# Patient Record
Sex: Female | Born: 1960
Health system: Southern US, Community
[De-identification: ages and names within clinical notes are randomized; demographics above are authoritative.]

## PROBLEM LIST (undated history)

## (undated) DIAGNOSIS — F32A Depression, unspecified: Secondary | ICD-10-CM

## (undated) DIAGNOSIS — F329 Major depressive disorder, single episode, unspecified: Secondary | ICD-10-CM

## (undated) DIAGNOSIS — J189 Pneumonia, unspecified organism: Secondary | ICD-10-CM

## (undated) DIAGNOSIS — M1990 Unspecified osteoarthritis, unspecified site: Secondary | ICD-10-CM

## (undated) DIAGNOSIS — F419 Anxiety disorder, unspecified: Secondary | ICD-10-CM

## (undated) HISTORY — PX: APPENDECTOMY: SHX54

## (undated) HISTORY — DX: Major depressive disorder, single episode, unspecified: F32.9

## (undated) HISTORY — DX: Anxiety disorder, unspecified: F41.9

## (undated) HISTORY — PX: KNEE ARTHROSCOPY: SUR90

## (undated) HISTORY — PX: GALLBLADDER SURGERY: SHX652

## (undated) HISTORY — DX: Unspecified osteoarthritis, unspecified site: M19.90

## (undated) HISTORY — DX: Depression, unspecified: F32.A

## (undated) HISTORY — PX: TONSILLECTOMY: SUR1361

---

## 2007-05-28 ENCOUNTER — Emergency Department (HOSPITAL_COMMUNITY): Admission: EM | Admit: 2007-05-28 | Discharge: 2007-05-28 | Payer: Self-pay | Admitting: Emergency Medicine

## 2011-08-21 LAB — URINALYSIS, ROUTINE W REFLEX MICROSCOPIC
Bilirubin Urine: NEGATIVE
Glucose, UA: NEGATIVE
Hgb urine dipstick: NEGATIVE
Ketones, ur: NEGATIVE
Nitrite: NEGATIVE
Protein, ur: NEGATIVE
Specific Gravity, Urine: 1.006
Urobilinogen, UA: 1
pH: 7

## 2011-08-21 LAB — CBC
HCT: 38.6
Hemoglobin: 13.5
RBC: 4.62
WBC: 8.9

## 2011-08-21 LAB — URINE MICROSCOPIC-ADD ON

## 2011-08-21 LAB — DIFFERENTIAL
Basophils Absolute: 0
Basophils Relative: 0
Eosinophils Absolute: 0.2
Eosinophils Relative: 2
Lymphocytes Relative: 34
Lymphs Abs: 3
Monocytes Absolute: 0.6
Monocytes Relative: 7
Neutro Abs: 5.1
Neutrophils Relative %: 57

## 2011-08-21 LAB — BASIC METABOLIC PANEL WITH GFR
BUN: 8
CO2: 26
Calcium: 9
Chloride: 112
Creatinine, Ser: 0.7
GFR calc non Af Amer: >60
Glucose, Bld: 122 — ABNORMAL HIGH
Potassium: 3.7
Sodium: 145

## 2011-08-21 LAB — PREGNANCY, URINE: Preg Test, Ur: NEGATIVE

## 2012-06-18 DIAGNOSIS — E782 Mixed hyperlipidemia: Secondary | ICD-10-CM | POA: Diagnosis not present

## 2012-06-18 DIAGNOSIS — R7309 Other abnormal glucose: Secondary | ICD-10-CM | POA: Diagnosis not present

## 2012-06-18 DIAGNOSIS — K219 Gastro-esophageal reflux disease without esophagitis: Secondary | ICD-10-CM | POA: Diagnosis not present

## 2012-06-18 DIAGNOSIS — B354 Tinea corporis: Secondary | ICD-10-CM | POA: Diagnosis not present

## 2012-06-18 DIAGNOSIS — R609 Edema, unspecified: Secondary | ICD-10-CM | POA: Diagnosis not present

## 2012-06-18 DIAGNOSIS — Z1231 Encounter for screening mammogram for malignant neoplasm of breast: Secondary | ICD-10-CM | POA: Diagnosis not present

## 2012-09-18 DIAGNOSIS — K219 Gastro-esophageal reflux disease without esophagitis: Secondary | ICD-10-CM | POA: Diagnosis not present

## 2012-09-18 DIAGNOSIS — E782 Mixed hyperlipidemia: Secondary | ICD-10-CM | POA: Diagnosis not present

## 2012-09-18 DIAGNOSIS — R7309 Other abnormal glucose: Secondary | ICD-10-CM | POA: Diagnosis not present

## 2012-09-18 DIAGNOSIS — Z6841 Body Mass Index (BMI) 40.0 and over, adult: Secondary | ICD-10-CM | POA: Diagnosis not present

## 2012-09-18 DIAGNOSIS — Z1231 Encounter for screening mammogram for malignant neoplasm of breast: Secondary | ICD-10-CM | POA: Diagnosis not present

## 2012-09-30 DIAGNOSIS — Z1231 Encounter for screening mammogram for malignant neoplasm of breast: Secondary | ICD-10-CM | POA: Diagnosis not present

## 2012-11-20 DIAGNOSIS — L0201 Cutaneous abscess of face: Secondary | ICD-10-CM | POA: Diagnosis not present

## 2012-11-20 DIAGNOSIS — Z2089 Contact with and (suspected) exposure to other communicable diseases: Secondary | ICD-10-CM | POA: Diagnosis not present

## 2012-11-20 DIAGNOSIS — L03211 Cellulitis of face: Secondary | ICD-10-CM | POA: Diagnosis not present

## 2013-04-15 DIAGNOSIS — K219 Gastro-esophageal reflux disease without esophagitis: Secondary | ICD-10-CM | POA: Diagnosis not present

## 2013-04-15 DIAGNOSIS — J0100 Acute maxillary sinusitis, unspecified: Secondary | ICD-10-CM | POA: Diagnosis not present

## 2013-04-15 DIAGNOSIS — I8390 Asymptomatic varicose veins of unspecified lower extremity: Secondary | ICD-10-CM | POA: Diagnosis not present

## 2013-04-15 DIAGNOSIS — E782 Mixed hyperlipidemia: Secondary | ICD-10-CM | POA: Diagnosis not present

## 2013-04-15 DIAGNOSIS — R7309 Other abnormal glucose: Secondary | ICD-10-CM | POA: Diagnosis not present

## 2013-08-27 DIAGNOSIS — K219 Gastro-esophageal reflux disease without esophagitis: Secondary | ICD-10-CM | POA: Diagnosis not present

## 2013-08-27 DIAGNOSIS — Z1231 Encounter for screening mammogram for malignant neoplasm of breast: Secondary | ICD-10-CM | POA: Diagnosis not present

## 2013-08-27 DIAGNOSIS — R7309 Other abnormal glucose: Secondary | ICD-10-CM | POA: Diagnosis not present

## 2013-08-27 DIAGNOSIS — M25579 Pain in unspecified ankle and joints of unspecified foot: Secondary | ICD-10-CM | POA: Diagnosis not present

## 2013-08-27 DIAGNOSIS — E782 Mixed hyperlipidemia: Secondary | ICD-10-CM | POA: Diagnosis not present

## 2013-11-12 DIAGNOSIS — F341 Dysthymic disorder: Secondary | ICD-10-CM | POA: Diagnosis not present

## 2013-11-12 DIAGNOSIS — B370 Candidal stomatitis: Secondary | ICD-10-CM | POA: Diagnosis not present

## 2013-11-25 DIAGNOSIS — N393 Stress incontinence (female) (male): Secondary | ICD-10-CM | POA: Diagnosis not present

## 2013-11-25 DIAGNOSIS — N3944 Nocturnal enuresis: Secondary | ICD-10-CM | POA: Diagnosis not present

## 2013-11-25 DIAGNOSIS — N390 Urinary tract infection, site not specified: Secondary | ICD-10-CM | POA: Diagnosis not present

## 2013-11-25 DIAGNOSIS — N318 Other neuromuscular dysfunction of bladder: Secondary | ICD-10-CM | POA: Diagnosis not present

## 2013-11-27 DIAGNOSIS — N3944 Nocturnal enuresis: Secondary | ICD-10-CM | POA: Diagnosis not present

## 2013-11-27 DIAGNOSIS — N393 Stress incontinence (female) (male): Secondary | ICD-10-CM | POA: Diagnosis not present

## 2013-11-27 DIAGNOSIS — N3941 Urge incontinence: Secondary | ICD-10-CM | POA: Diagnosis not present

## 2013-11-27 DIAGNOSIS — N318 Other neuromuscular dysfunction of bladder: Secondary | ICD-10-CM | POA: Diagnosis not present

## 2013-12-10 DIAGNOSIS — E782 Mixed hyperlipidemia: Secondary | ICD-10-CM | POA: Diagnosis not present

## 2013-12-10 DIAGNOSIS — F341 Dysthymic disorder: Secondary | ICD-10-CM | POA: Diagnosis not present

## 2013-12-10 DIAGNOSIS — R7309 Other abnormal glucose: Secondary | ICD-10-CM | POA: Diagnosis not present

## 2013-12-10 DIAGNOSIS — Z1231 Encounter for screening mammogram for malignant neoplasm of breast: Secondary | ICD-10-CM | POA: Diagnosis not present

## 2013-12-10 DIAGNOSIS — K219 Gastro-esophageal reflux disease without esophagitis: Secondary | ICD-10-CM | POA: Diagnosis not present

## 2014-01-16 DIAGNOSIS — N318 Other neuromuscular dysfunction of bladder: Secondary | ICD-10-CM | POA: Diagnosis not present

## 2014-01-16 DIAGNOSIS — N393 Stress incontinence (female) (male): Secondary | ICD-10-CM | POA: Diagnosis not present

## 2014-01-16 DIAGNOSIS — N3941 Urge incontinence: Secondary | ICD-10-CM | POA: Diagnosis not present

## 2014-01-16 DIAGNOSIS — N390 Urinary tract infection, site not specified: Secondary | ICD-10-CM | POA: Diagnosis not present

## 2014-01-30 DIAGNOSIS — N3941 Urge incontinence: Secondary | ICD-10-CM | POA: Diagnosis not present

## 2014-01-30 DIAGNOSIS — N390 Urinary tract infection, site not specified: Secondary | ICD-10-CM | POA: Diagnosis not present

## 2014-02-24 DIAGNOSIS — M25559 Pain in unspecified hip: Secondary | ICD-10-CM | POA: Diagnosis not present

## 2014-03-10 ENCOUNTER — Ambulatory Visit (INDEPENDENT_AMBULATORY_CARE_PROVIDER_SITE_OTHER): Payer: BC Managed Care – PPO | Admitting: Podiatrist

## 2014-03-10 ENCOUNTER — Encounter: Payer: Self-pay | Admitting: Podiatrist

## 2014-03-10 ENCOUNTER — Ambulatory Visit (INDEPENDENT_AMBULATORY_CARE_PROVIDER_SITE_OTHER): Payer: BC Managed Care – PPO

## 2014-03-10 DIAGNOSIS — M21619 Bunion of unspecified foot: Secondary | ICD-10-CM | POA: Diagnosis not present

## 2014-03-10 DIAGNOSIS — R52 Pain, unspecified: Secondary | ICD-10-CM

## 2014-03-10 DIAGNOSIS — R609 Edema, unspecified: Secondary | ICD-10-CM

## 2014-03-10 DIAGNOSIS — M216X9 Other acquired deformities of unspecified foot: Secondary | ICD-10-CM | POA: Diagnosis not present

## 2014-03-10 NOTE — Progress Notes (Signed)
   Subjective:    Patient ID: Ellen Bell, female    DOB: 08/02/1961, 53 y.o.   MRN: [id number]  HPI my feet on top and all the way to the front of the ankle and they have been hurting for about five to six months and throbbing and numbness and tingling in toes and swelling     Review of Systems  Constitutional: Positive for activity change.  HENT: Positive for sinus pressure and sore throat.   Cardiovascular: Positive for leg swelling.  Gastrointestinal: Positive for constipation.  Musculoskeletal: Positive for gait problem.       Joint pain   Skin: Positive for color change.  All other systems reviewed and are negative.      Objective:   Physical Exam GENERAL APPEARANCE: Alert, conversant. Appropriately groomed. No acute distress.  VASCULAR: Pedal pulses palpable at 2/4 DP and PT bilateral.  Swelling on the left greater than right lower leg is noted bilateral. Varicose veins are present bilateral.. Indurated tissue is present left greater than right. NEUROLOGIC: sensation is intact epicritically and protectively to 5.07 monofilament at 5/5 sites bilateral.  Light touch is intact bilateral, vibratory sensation intact bilateral, achilles tendon reflex is intact bilateral.  MUSCULOSKELETAL: Bunion deformities bilateral with contracted and crowded hammertoes are present bilateral. Multiple dorsiflexion plantarflexion inversion eversion present. DERMATOLOGIC: Large hyperkeratotic lesions present submetatarsal 2 through 5 bilateral. Mild discomfort present.       Assessment & Plan:  Swelling, edema, and calluses bilateral  Plan: Recommended compressive stockings elastic therapy. Also recommended orthotic inserts with a metatarsal pad to reduce the pressure from lesions submetatarsal bilaterally. We will check on her orthotic coverage. I will see her back for dispensing of inserts if she decides to get these. Otherwise she'll be seen back as needed.

## 2014-03-10 NOTE — Patient Instructions (Signed)
Edema Edema is an abnormal build-up of fluids in tissues. Because this is partly dependent on gravity (water flows to the lowest place), it is more common in the legs and thighs (lower extremities). It is also common in the looser tissues, like around the eyes. Painless swelling of the feet and ankles is common and increases as a person ages. It may affect both legs and may include the calves or even thighs. When squeezed, the fluid may move out of the affected area and may leave a dent for a few moments. CAUSES   Prolonged standing or sitting in one place for extended periods of time. Movement helps pump tissue fluid into the veins, and absence of movement prevents this, resulting in edema.  Varicose veins. The valves in the veins do not work as well as they should. This causes fluid to leak into the tissues.  Fluid and salt overload.  Injury, burn, or surgery to the leg, ankle, or foot, may damage veins and allow fluid to leak out.  Sunburn damages vessels. Leaky vessels allow fluid to go out into the sunburned tissues.  Allergies (from insect bites or stings, medications or chemicals) cause swelling by allowing vessels to become leaky.  Protein in the blood helps keep fluid in your vessels. Low protein, as in malnutrition, allows fluid to leak out.  Hormonal changes, including pregnancy and menstruation, cause fluid retention. This fluid may leak out of vessels and cause edema.  Medications that cause fluid retention. Examples are sex hormones, blood pressure medications, steroid treatment, or anti-depressants.  Some illnesses cause edema, especially heart failure, kidney disease, or liver disease.  Surgery that cuts veins or lymph nodes, such as surgery done for the heart or for breast cancer, may result in edema. DIAGNOSIS  Your caregiver is usually easily able to determine what is causing your swelling (edema) by simply asking what is wrong (getting a history) and examining you (doing  a physical). Sometimes x-rays, EKG (electrocardiogram or heart tracing), and blood work may be done to evaluate for underlying medical illness. TREATMENT  General treatment includes:  Leg elevation (or elevation of the affected body part).  Restriction of fluid intake.  Prevention of fluid overload.  Compression of the affected body part. Compression with elastic bandages or support stockings squeezes the tissues, preventing fluid from entering and forcing it back into the blood vessels.  Diuretics (also called water pills or fluid pills) pull fluid out of your body in the form of increased urination. These are effective in reducing the swelling, but can have side effects and must be used only under your caregiver's supervision. Diuretics are appropriate only for some types of edema. The specific treatment can be directed at any underlying causes discovered. Heart, liver, or kidney disease should be treated appropriately. HOME CARE INSTRUCTIONS   Elevate the legs (or affected body part) above the level of the heart, while lying down.  Avoid sitting or standing still for prolonged periods of time.  Avoid putting anything directly under the knees when lying down, and do not wear constricting clothing or garters on the upper legs.  Exercising the legs causes the fluid to work back into the veins and lymphatic channels. This may help the swelling go down.  The pressure applied by elastic bandages or support stockings can help reduce ankle swelling.  A low-salt diet may help reduce fluid retention and decrease the ankle swelling.  Take any medications exactly as prescribed. SEEK MEDICAL CARE IF:  Your edema is   not responding to recommended treatments. SEEK IMMEDIATE MEDICAL CARE IF:   You develop shortness of breath or chest pain.  You cannot breathe when you lay down; or if, while lying down, you have to get up and go to the window to get your breath.  You are having increasing  swelling without relief from treatment.  You develop a fever over 102 F (38.9 C).  You develop pain or redness in the areas that are swollen.  Tell your caregiver right away if you have gained 03 lb/1.4 kg in 1 day or 05 lb/2.3 kg in a week. MAKE SURE YOU:   Understand these instructions.  Will watch your condition.  Will get help right away if you are not doing well or get worse. Document Released: 10/23/2005 Document Revised: 04/23/2012 Document Reviewed: 06/10/2008 [employer] Patient Information 2014 [employer].  

## 2014-03-11 DIAGNOSIS — M25559 Pain in unspecified hip: Secondary | ICD-10-CM | POA: Diagnosis not present

## 2014-03-11 DIAGNOSIS — M259 Joint disorder, unspecified: Secondary | ICD-10-CM | POA: Diagnosis not present

## 2014-03-12 DIAGNOSIS — R7309 Other abnormal glucose: Secondary | ICD-10-CM | POA: Diagnosis not present

## 2014-03-12 DIAGNOSIS — E782 Mixed hyperlipidemia: Secondary | ICD-10-CM | POA: Diagnosis not present

## 2014-03-12 DIAGNOSIS — K219 Gastro-esophageal reflux disease without esophagitis: Secondary | ICD-10-CM | POA: Diagnosis not present

## 2014-03-12 DIAGNOSIS — F341 Dysthymic disorder: Secondary | ICD-10-CM | POA: Diagnosis not present

## 2014-06-12 DIAGNOSIS — R7309 Other abnormal glucose: Secondary | ICD-10-CM | POA: Diagnosis not present

## 2014-06-12 DIAGNOSIS — F341 Dysthymic disorder: Secondary | ICD-10-CM | POA: Diagnosis not present

## 2014-06-12 DIAGNOSIS — E782 Mixed hyperlipidemia: Secondary | ICD-10-CM | POA: Diagnosis not present

## 2014-06-12 DIAGNOSIS — K219 Gastro-esophageal reflux disease without esophagitis: Secondary | ICD-10-CM | POA: Diagnosis not present

## 2014-06-16 DIAGNOSIS — Z1231 Encounter for screening mammogram for malignant neoplasm of breast: Secondary | ICD-10-CM | POA: Diagnosis not present

## 2014-12-08 DIAGNOSIS — R7301 Impaired fasting glucose: Secondary | ICD-10-CM | POA: Diagnosis not present

## 2014-12-08 DIAGNOSIS — M25551 Pain in right hip: Secondary | ICD-10-CM | POA: Diagnosis not present

## 2014-12-08 DIAGNOSIS — K219 Gastro-esophageal reflux disease without esophagitis: Secondary | ICD-10-CM | POA: Diagnosis not present

## 2014-12-08 DIAGNOSIS — Z5181 Encounter for therapeutic drug level monitoring: Secondary | ICD-10-CM | POA: Diagnosis not present

## 2014-12-08 DIAGNOSIS — E782 Mixed hyperlipidemia: Secondary | ICD-10-CM | POA: Diagnosis not present

## 2015-04-07 DIAGNOSIS — M179 Osteoarthritis of knee, unspecified: Secondary | ICD-10-CM | POA: Diagnosis not present

## 2015-05-13 DIAGNOSIS — M150 Primary generalized (osteo)arthritis: Secondary | ICD-10-CM | POA: Diagnosis not present

## 2015-05-13 DIAGNOSIS — E785 Hyperlipidemia, unspecified: Secondary | ICD-10-CM | POA: Diagnosis not present

## 2015-05-13 DIAGNOSIS — F328 Other depressive episodes: Secondary | ICD-10-CM | POA: Diagnosis not present

## 2015-07-21 DIAGNOSIS — N760 Acute vaginitis: Secondary | ICD-10-CM | POA: Diagnosis not present

## 2015-07-21 DIAGNOSIS — M150 Primary generalized (osteo)arthritis: Secondary | ICD-10-CM | POA: Diagnosis not present

## 2015-07-28 DIAGNOSIS — R21 Rash and other nonspecific skin eruption: Secondary | ICD-10-CM | POA: Diagnosis not present

## 2015-07-28 DIAGNOSIS — B372 Candidiasis of skin and nail: Secondary | ICD-10-CM | POA: Diagnosis not present

## 2015-07-28 DIAGNOSIS — E785 Hyperlipidemia, unspecified: Secondary | ICD-10-CM | POA: Diagnosis not present

## 2015-07-28 DIAGNOSIS — F328 Other depressive episodes: Secondary | ICD-10-CM | POA: Diagnosis not present

## 2015-07-28 DIAGNOSIS — M150 Primary generalized (osteo)arthritis: Secondary | ICD-10-CM | POA: Diagnosis not present

## 2015-09-07 DIAGNOSIS — Z01411 Encounter for gynecological examination (general) (routine) with abnormal findings: Secondary | ICD-10-CM | POA: Diagnosis not present

## 2015-09-07 DIAGNOSIS — F17200 Nicotine dependence, unspecified, uncomplicated: Secondary | ICD-10-CM | POA: Diagnosis not present

## 2015-09-07 DIAGNOSIS — Z1211 Encounter for screening for malignant neoplasm of colon: Secondary | ICD-10-CM | POA: Diagnosis not present

## 2015-09-07 DIAGNOSIS — F320 Major depressive disorder, single episode, mild: Secondary | ICD-10-CM | POA: Diagnosis not present

## 2015-09-07 DIAGNOSIS — F3289 Other specified depressive episodes: Secondary | ICD-10-CM | POA: Diagnosis not present

## 2015-12-08 DIAGNOSIS — E785 Hyperlipidemia, unspecified: Secondary | ICD-10-CM | POA: Diagnosis not present

## 2015-12-08 DIAGNOSIS — M150 Primary generalized (osteo)arthritis: Secondary | ICD-10-CM | POA: Diagnosis not present

## 2015-12-08 DIAGNOSIS — B372 Candidiasis of skin and nail: Secondary | ICD-10-CM | POA: Diagnosis not present

## 2016-01-31 DIAGNOSIS — M150 Primary generalized (osteo)arthritis: Secondary | ICD-10-CM | POA: Diagnosis not present

## 2016-05-08 DIAGNOSIS — R0902 Hypoxemia: Secondary | ICD-10-CM | POA: Diagnosis not present

## 2016-05-08 DIAGNOSIS — R062 Wheezing: Secondary | ICD-10-CM | POA: Diagnosis not present

## 2016-05-08 DIAGNOSIS — J189 Pneumonia, unspecified organism: Secondary | ICD-10-CM | POA: Diagnosis not present

## 2016-05-08 DIAGNOSIS — R0602 Shortness of breath: Secondary | ICD-10-CM | POA: Diagnosis not present

## 2016-06-01 DIAGNOSIS — M150 Primary generalized (osteo)arthritis: Secondary | ICD-10-CM | POA: Diagnosis not present

## 2016-06-01 DIAGNOSIS — R609 Edema, unspecified: Secondary | ICD-10-CM | POA: Diagnosis not present

## 2016-06-01 DIAGNOSIS — E785 Hyperlipidemia, unspecified: Secondary | ICD-10-CM | POA: Diagnosis not present

## 2016-06-01 DIAGNOSIS — K219 Gastro-esophageal reflux disease without esophagitis: Secondary | ICD-10-CM | POA: Diagnosis not present

## 2016-07-05 ENCOUNTER — Other Ambulatory Visit: Payer: Self-pay | Admitting: Pharmacist

## 2016-07-05 NOTE — Patient Outreach (Signed)
Outreach call to Ellen Bell regarding her [facility] for [facility]. Left a HIPAA compliant message on the patient's voicemail.  Duanne MoronElisabeth Junah Yam, PharmD Clinical Pharmacist [facility] Care Management [phone]

## 2016-09-18 DIAGNOSIS — E785 Hyperlipidemia, unspecified: Secondary | ICD-10-CM | POA: Diagnosis not present

## 2016-09-18 DIAGNOSIS — Z0000 Encounter for general adult medical examination without abnormal findings: Secondary | ICD-10-CM | POA: Diagnosis not present

## 2016-09-18 DIAGNOSIS — M150 Primary generalized (osteo)arthritis: Secondary | ICD-10-CM | POA: Diagnosis not present

## 2016-09-18 DIAGNOSIS — J069 Acute upper respiratory infection, unspecified: Secondary | ICD-10-CM | POA: Diagnosis not present

## 2016-09-21 DIAGNOSIS — R0989 Other specified symptoms and signs involving the circulatory and respiratory systems: Secondary | ICD-10-CM | POA: Diagnosis not present

## 2016-09-21 DIAGNOSIS — R0602 Shortness of breath: Secondary | ICD-10-CM | POA: Diagnosis not present

## 2016-09-21 DIAGNOSIS — R05 Cough: Secondary | ICD-10-CM | POA: Diagnosis not present

## 2016-11-01 DIAGNOSIS — E7800 Pure hypercholesterolemia, unspecified: Secondary | ICD-10-CM | POA: Diagnosis not present

## 2016-11-01 DIAGNOSIS — B372 Candidiasis of skin and nail: Secondary | ICD-10-CM | POA: Diagnosis not present

## 2016-12-21 DIAGNOSIS — R109 Unspecified abdominal pain: Secondary | ICD-10-CM | POA: Diagnosis not present

## 2016-12-21 DIAGNOSIS — N23 Unspecified renal colic: Secondary | ICD-10-CM | POA: Diagnosis not present

## 2017-03-05 DIAGNOSIS — D649 Anemia, unspecified: Secondary | ICD-10-CM | POA: Diagnosis not present

## 2017-03-05 DIAGNOSIS — E7800 Pure hypercholesterolemia, unspecified: Secondary | ICD-10-CM | POA: Diagnosis not present

## 2017-05-14 DIAGNOSIS — N3281 Overactive bladder: Secondary | ICD-10-CM | POA: Diagnosis not present

## 2017-06-18 DIAGNOSIS — N3281 Overactive bladder: Secondary | ICD-10-CM | POA: Diagnosis not present

## 2017-06-18 DIAGNOSIS — R682 Dry mouth, unspecified: Secondary | ICD-10-CM | POA: Diagnosis not present

## 2017-06-22 DIAGNOSIS — M170 Bilateral primary osteoarthritis of knee: Secondary | ICD-10-CM | POA: Diagnosis not present

## 2017-07-02 DIAGNOSIS — K140 Glossitis: Secondary | ICD-10-CM | POA: Diagnosis not present

## 2017-08-07 DIAGNOSIS — N3281 Overactive bladder: Secondary | ICD-10-CM | POA: Diagnosis not present

## 2017-08-29 DIAGNOSIS — K219 Gastro-esophageal reflux disease without esophagitis: Secondary | ICD-10-CM | POA: Diagnosis not present

## 2017-08-29 DIAGNOSIS — D649 Anemia, unspecified: Secondary | ICD-10-CM | POA: Diagnosis not present

## 2017-08-29 DIAGNOSIS — E7800 Pure hypercholesterolemia, unspecified: Secondary | ICD-10-CM | POA: Diagnosis not present

## 2017-08-29 DIAGNOSIS — Z79891 Long term (current) use of opiate analgesic: Secondary | ICD-10-CM | POA: Diagnosis not present

## 2017-09-24 DIAGNOSIS — N3281 Overactive bladder: Secondary | ICD-10-CM | POA: Diagnosis not present

## 2017-11-12 DIAGNOSIS — B029 Zoster without complications: Secondary | ICD-10-CM | POA: Diagnosis not present

## 2018-01-11 DIAGNOSIS — I80202 Phlebitis and thrombophlebitis of unspecified deep vessels of left lower extremity: Secondary | ICD-10-CM | POA: Diagnosis not present

## 2018-01-11 DIAGNOSIS — I83813 Varicose veins of bilateral lower extremities with pain: Secondary | ICD-10-CM | POA: Diagnosis not present

## 2018-01-11 DIAGNOSIS — R600 Localized edema: Secondary | ICD-10-CM | POA: Diagnosis not present

## 2018-01-16 DIAGNOSIS — I80202 Phlebitis and thrombophlebitis of unspecified deep vessels of left lower extremity: Secondary | ICD-10-CM | POA: Diagnosis not present

## 2018-01-16 DIAGNOSIS — I83813 Varicose veins of bilateral lower extremities with pain: Secondary | ICD-10-CM | POA: Diagnosis not present

## 2018-01-16 DIAGNOSIS — R600 Localized edema: Secondary | ICD-10-CM | POA: Diagnosis not present

## 2018-02-01 DIAGNOSIS — M150 Primary generalized (osteo)arthritis: Secondary | ICD-10-CM | POA: Diagnosis not present

## 2018-02-01 DIAGNOSIS — Z01419 Encounter for gynecological examination (general) (routine) without abnormal findings: Secondary | ICD-10-CM | POA: Diagnosis not present

## 2018-02-01 DIAGNOSIS — E785 Hyperlipidemia, unspecified: Secondary | ICD-10-CM | POA: Diagnosis not present

## 2018-02-01 DIAGNOSIS — Z1389 Encounter for screening for other disorder: Secondary | ICD-10-CM | POA: Diagnosis not present

## 2018-02-01 DIAGNOSIS — K219 Gastro-esophageal reflux disease without esophagitis: Secondary | ICD-10-CM | POA: Diagnosis not present

## 2018-02-01 DIAGNOSIS — Z23 Encounter for immunization: Secondary | ICD-10-CM | POA: Diagnosis not present

## 2018-02-25 ENCOUNTER — Encounter: Payer: Self-pay | Admitting: Podiatry

## 2018-03-04 NOTE — Progress Notes (Signed)
Erroneous

## 2018-04-15 DIAGNOSIS — R1084 Generalized abdominal pain: Secondary | ICD-10-CM | POA: Diagnosis not present

## 2018-04-15 DIAGNOSIS — N3281 Overactive bladder: Secondary | ICD-10-CM | POA: Diagnosis not present

## 2018-04-15 DIAGNOSIS — R509 Fever, unspecified: Secondary | ICD-10-CM | POA: Diagnosis not present

## 2018-04-16 DIAGNOSIS — R110 Nausea: Secondary | ICD-10-CM | POA: Diagnosis not present

## 2018-04-16 DIAGNOSIS — R509 Fever, unspecified: Secondary | ICD-10-CM | POA: Diagnosis not present

## 2018-04-16 DIAGNOSIS — R1084 Generalized abdominal pain: Secondary | ICD-10-CM | POA: Diagnosis not present

## 2018-05-06 DIAGNOSIS — E785 Hyperlipidemia, unspecified: Secondary | ICD-10-CM | POA: Diagnosis not present

## 2018-05-06 DIAGNOSIS — D649 Anemia, unspecified: Secondary | ICD-10-CM | POA: Diagnosis not present

## 2018-05-06 DIAGNOSIS — M150 Primary generalized (osteo)arthritis: Secondary | ICD-10-CM | POA: Diagnosis not present

## 2018-05-06 DIAGNOSIS — K219 Gastro-esophageal reflux disease without esophagitis: Secondary | ICD-10-CM | POA: Diagnosis not present

## 2018-06-26 DIAGNOSIS — I8312 Varicose veins of left lower extremity with inflammation: Secondary | ICD-10-CM | POA: Diagnosis not present

## 2018-06-26 DIAGNOSIS — I8311 Varicose veins of right lower extremity with inflammation: Secondary | ICD-10-CM | POA: Diagnosis not present

## 2018-09-27 DIAGNOSIS — D649 Anemia, unspecified: Secondary | ICD-10-CM | POA: Diagnosis not present

## 2018-09-27 DIAGNOSIS — Z23 Encounter for immunization: Secondary | ICD-10-CM | POA: Diagnosis not present

## 2018-09-27 DIAGNOSIS — M150 Primary generalized (osteo)arthritis: Secondary | ICD-10-CM | POA: Diagnosis not present

## 2018-09-27 DIAGNOSIS — K219 Gastro-esophageal reflux disease without esophagitis: Secondary | ICD-10-CM | POA: Diagnosis not present

## 2018-09-27 DIAGNOSIS — E7800 Pure hypercholesterolemia, unspecified: Secondary | ICD-10-CM | POA: Diagnosis not present

## 2018-10-07 DIAGNOSIS — L853 Xerosis cutis: Secondary | ICD-10-CM | POA: Diagnosis not present

## 2018-10-07 DIAGNOSIS — L84 Corns and callosities: Secondary | ICD-10-CM | POA: Diagnosis not present

## 2018-10-07 DIAGNOSIS — M2011 Hallux valgus (acquired), right foot: Secondary | ICD-10-CM | POA: Diagnosis not present

## 2018-10-07 DIAGNOSIS — M2012 Hallux valgus (acquired), left foot: Secondary | ICD-10-CM | POA: Diagnosis not present

## 2018-10-12 DIAGNOSIS — R0689 Other abnormalities of breathing: Secondary | ICD-10-CM | POA: Diagnosis not present

## 2018-10-12 DIAGNOSIS — J449 Chronic obstructive pulmonary disease, unspecified: Secondary | ICD-10-CM | POA: Diagnosis not present

## 2018-10-12 DIAGNOSIS — J189 Pneumonia, unspecified organism: Secondary | ICD-10-CM | POA: Diagnosis not present

## 2018-10-12 DIAGNOSIS — F418 Other specified anxiety disorders: Secondary | ICD-10-CM

## 2018-10-12 DIAGNOSIS — A419 Sepsis, unspecified organism: Secondary | ICD-10-CM | POA: Diagnosis not present

## 2018-10-12 DIAGNOSIS — R0902 Hypoxemia: Secondary | ICD-10-CM | POA: Diagnosis not present

## 2018-10-12 DIAGNOSIS — L03116 Cellulitis of left lower limb: Secondary | ICD-10-CM | POA: Diagnosis not present

## 2018-10-12 DIAGNOSIS — Z743 Need for continuous supervision: Secondary | ICD-10-CM | POA: Diagnosis not present

## 2018-10-12 DIAGNOSIS — R069 Unspecified abnormalities of breathing: Secondary | ICD-10-CM | POA: Diagnosis not present

## 2018-10-12 DIAGNOSIS — N3281 Overactive bladder: Secondary | ICD-10-CM

## 2018-10-12 DIAGNOSIS — R0602 Shortness of breath: Secondary | ICD-10-CM | POA: Diagnosis not present

## 2018-10-12 DIAGNOSIS — D72829 Elevated white blood cell count, unspecified: Secondary | ICD-10-CM

## 2018-10-12 DIAGNOSIS — R05 Cough: Secondary | ICD-10-CM | POA: Diagnosis not present

## 2018-10-12 DIAGNOSIS — J9601 Acute respiratory failure with hypoxia: Secondary | ICD-10-CM

## 2018-10-12 DIAGNOSIS — J441 Chronic obstructive pulmonary disease with (acute) exacerbation: Secondary | ICD-10-CM

## 2018-10-21 DIAGNOSIS — R600 Localized edema: Secondary | ICD-10-CM | POA: Diagnosis not present

## 2018-10-21 DIAGNOSIS — J441 Chronic obstructive pulmonary disease with (acute) exacerbation: Secondary | ICD-10-CM | POA: Diagnosis not present

## 2018-10-21 DIAGNOSIS — L03116 Cellulitis of left lower limb: Secondary | ICD-10-CM | POA: Diagnosis not present

## 2018-10-21 DIAGNOSIS — L0390 Cellulitis, unspecified: Secondary | ICD-10-CM | POA: Diagnosis not present

## 2018-11-15 DIAGNOSIS — L03116 Cellulitis of left lower limb: Secondary | ICD-10-CM | POA: Diagnosis not present

## 2018-12-11 ENCOUNTER — Ambulatory Visit: Payer: Medicare Other | Admitting: Sports Medicine

## 2019-01-06 DIAGNOSIS — M170 Bilateral primary osteoarthritis of knee: Secondary | ICD-10-CM | POA: Diagnosis not present

## 2019-01-16 DIAGNOSIS — E7800 Pure hypercholesterolemia, unspecified: Secondary | ICD-10-CM | POA: Diagnosis not present

## 2019-01-16 DIAGNOSIS — K219 Gastro-esophageal reflux disease without esophagitis: Secondary | ICD-10-CM | POA: Diagnosis not present

## 2019-01-16 DIAGNOSIS — B372 Candidiasis of skin and nail: Secondary | ICD-10-CM | POA: Diagnosis not present

## 2019-03-07 DIAGNOSIS — N3281 Overactive bladder: Secondary | ICD-10-CM | POA: Diagnosis not present

## 2019-04-10 DIAGNOSIS — M170 Bilateral primary osteoarthritis of knee: Secondary | ICD-10-CM | POA: Diagnosis not present

## 2019-04-21 DIAGNOSIS — M450 Ankylosing spondylitis of multiple sites in spine: Secondary | ICD-10-CM | POA: Diagnosis not present

## 2019-04-21 DIAGNOSIS — I10 Essential (primary) hypertension: Secondary | ICD-10-CM | POA: Diagnosis not present

## 2019-05-28 DIAGNOSIS — R5383 Other fatigue: Secondary | ICD-10-CM | POA: Diagnosis not present

## 2019-05-28 DIAGNOSIS — D539 Nutritional anemia, unspecified: Secondary | ICD-10-CM | POA: Diagnosis not present

## 2019-05-28 DIAGNOSIS — E7800 Pure hypercholesterolemia, unspecified: Secondary | ICD-10-CM | POA: Diagnosis not present

## 2019-05-28 DIAGNOSIS — M25551 Pain in right hip: Secondary | ICD-10-CM | POA: Diagnosis not present

## 2019-05-28 DIAGNOSIS — Z1159 Encounter for screening for other viral diseases: Secondary | ICD-10-CM | POA: Diagnosis not present

## 2019-05-28 DIAGNOSIS — Z131 Encounter for screening for diabetes mellitus: Secondary | ICD-10-CM | POA: Diagnosis not present

## 2019-05-28 DIAGNOSIS — Z79899 Other long term (current) drug therapy: Secondary | ICD-10-CM | POA: Diagnosis not present

## 2019-06-30 DIAGNOSIS — M25562 Pain in left knee: Secondary | ICD-10-CM | POA: Diagnosis not present

## 2019-06-30 DIAGNOSIS — G8929 Other chronic pain: Secondary | ICD-10-CM | POA: Diagnosis not present

## 2019-06-30 DIAGNOSIS — Z79899 Other long term (current) drug therapy: Secondary | ICD-10-CM | POA: Diagnosis not present

## 2019-06-30 DIAGNOSIS — M25561 Pain in right knee: Secondary | ICD-10-CM | POA: Diagnosis not present

## 2019-06-30 DIAGNOSIS — M170 Bilateral primary osteoarthritis of knee: Secondary | ICD-10-CM | POA: Diagnosis not present

## 2019-06-30 DIAGNOSIS — Z1159 Encounter for screening for other viral diseases: Secondary | ICD-10-CM | POA: Diagnosis not present

## 2019-07-29 DIAGNOSIS — M25561 Pain in right knee: Secondary | ICD-10-CM | POA: Diagnosis not present

## 2019-07-29 DIAGNOSIS — Z79899 Other long term (current) drug therapy: Secondary | ICD-10-CM | POA: Diagnosis not present

## 2019-07-29 DIAGNOSIS — G8929 Other chronic pain: Secondary | ICD-10-CM | POA: Diagnosis not present

## 2019-07-29 DIAGNOSIS — M25551 Pain in right hip: Secondary | ICD-10-CM | POA: Diagnosis not present

## 2019-07-29 DIAGNOSIS — M25562 Pain in left knee: Secondary | ICD-10-CM | POA: Diagnosis not present

## 2019-08-01 DIAGNOSIS — K219 Gastro-esophageal reflux disease without esophagitis: Secondary | ICD-10-CM | POA: Diagnosis not present

## 2019-08-01 DIAGNOSIS — R1084 Generalized abdominal pain: Secondary | ICD-10-CM | POA: Diagnosis not present

## 2019-08-01 DIAGNOSIS — R112 Nausea with vomiting, unspecified: Secondary | ICD-10-CM | POA: Diagnosis not present

## 2019-08-07 DIAGNOSIS — M1711 Unilateral primary osteoarthritis, right knee: Secondary | ICD-10-CM | POA: Diagnosis not present

## 2019-08-27 DIAGNOSIS — M170 Bilateral primary osteoarthritis of knee: Secondary | ICD-10-CM | POA: Diagnosis not present

## 2019-08-27 DIAGNOSIS — G8929 Other chronic pain: Secondary | ICD-10-CM | POA: Diagnosis not present

## 2019-08-27 DIAGNOSIS — Z79899 Other long term (current) drug therapy: Secondary | ICD-10-CM | POA: Diagnosis not present

## 2019-08-27 DIAGNOSIS — M25561 Pain in right knee: Secondary | ICD-10-CM | POA: Diagnosis not present

## 2019-08-27 DIAGNOSIS — M25562 Pain in left knee: Secondary | ICD-10-CM | POA: Diagnosis not present

## 2019-09-25 DIAGNOSIS — M25551 Pain in right hip: Secondary | ICD-10-CM | POA: Diagnosis not present

## 2019-09-25 DIAGNOSIS — Z79899 Other long term (current) drug therapy: Secondary | ICD-10-CM | POA: Diagnosis not present

## 2019-09-25 DIAGNOSIS — J018 Other acute sinusitis: Secondary | ICD-10-CM | POA: Diagnosis not present

## 2019-09-25 DIAGNOSIS — G8929 Other chronic pain: Secondary | ICD-10-CM | POA: Diagnosis not present

## 2019-10-23 DIAGNOSIS — G8929 Other chronic pain: Secondary | ICD-10-CM | POA: Diagnosis not present

## 2019-10-23 DIAGNOSIS — M25562 Pain in left knee: Secondary | ICD-10-CM | POA: Diagnosis not present

## 2019-10-23 DIAGNOSIS — M25561 Pain in right knee: Secondary | ICD-10-CM | POA: Diagnosis not present

## 2019-10-23 DIAGNOSIS — M25551 Pain in right hip: Secondary | ICD-10-CM | POA: Diagnosis not present

## 2019-10-23 DIAGNOSIS — Z79899 Other long term (current) drug therapy: Secondary | ICD-10-CM | POA: Diagnosis not present

## 2019-11-20 DIAGNOSIS — M25551 Pain in right hip: Secondary | ICD-10-CM | POA: Diagnosis not present

## 2019-11-20 DIAGNOSIS — G8929 Other chronic pain: Secondary | ICD-10-CM | POA: Diagnosis not present

## 2019-11-20 DIAGNOSIS — F17210 Nicotine dependence, cigarettes, uncomplicated: Secondary | ICD-10-CM | POA: Diagnosis not present

## 2019-11-20 DIAGNOSIS — M25562 Pain in left knee: Secondary | ICD-10-CM | POA: Diagnosis not present

## 2019-11-20 DIAGNOSIS — Z79899 Other long term (current) drug therapy: Secondary | ICD-10-CM | POA: Diagnosis not present

## 2019-11-20 DIAGNOSIS — M25561 Pain in right knee: Secondary | ICD-10-CM | POA: Diagnosis not present

## 2019-12-22 DIAGNOSIS — E559 Vitamin D deficiency, unspecified: Secondary | ICD-10-CM | POA: Diagnosis not present

## 2019-12-22 DIAGNOSIS — M25562 Pain in left knee: Secondary | ICD-10-CM | POA: Diagnosis not present

## 2019-12-22 DIAGNOSIS — M25551 Pain in right hip: Secondary | ICD-10-CM | POA: Diagnosis not present

## 2019-12-22 DIAGNOSIS — E1165 Type 2 diabetes mellitus with hyperglycemia: Secondary | ICD-10-CM | POA: Diagnosis not present

## 2019-12-22 DIAGNOSIS — E7800 Pure hypercholesterolemia, unspecified: Secondary | ICD-10-CM | POA: Diagnosis not present

## 2019-12-22 DIAGNOSIS — D539 Nutritional anemia, unspecified: Secondary | ICD-10-CM | POA: Diagnosis not present

## 2019-12-22 DIAGNOSIS — M25561 Pain in right knee: Secondary | ICD-10-CM | POA: Diagnosis not present

## 2019-12-22 DIAGNOSIS — Z79899 Other long term (current) drug therapy: Secondary | ICD-10-CM | POA: Diagnosis not present

## 2019-12-22 DIAGNOSIS — G8929 Other chronic pain: Secondary | ICD-10-CM | POA: Diagnosis not present

## 2020-01-19 DIAGNOSIS — F17210 Nicotine dependence, cigarettes, uncomplicated: Secondary | ICD-10-CM | POA: Diagnosis not present

## 2020-01-19 DIAGNOSIS — M25561 Pain in right knee: Secondary | ICD-10-CM | POA: Diagnosis not present

## 2020-01-19 DIAGNOSIS — M25562 Pain in left knee: Secondary | ICD-10-CM | POA: Diagnosis not present

## 2020-01-19 DIAGNOSIS — M25551 Pain in right hip: Secondary | ICD-10-CM | POA: Diagnosis not present

## 2020-01-19 DIAGNOSIS — Z79899 Other long term (current) drug therapy: Secondary | ICD-10-CM | POA: Diagnosis not present

## 2020-01-19 DIAGNOSIS — G8929 Other chronic pain: Secondary | ICD-10-CM | POA: Diagnosis not present

## 2020-02-08 DIAGNOSIS — R52 Pain, unspecified: Secondary | ICD-10-CM | POA: Diagnosis not present

## 2020-02-08 DIAGNOSIS — R1084 Generalized abdominal pain: Secondary | ICD-10-CM | POA: Diagnosis not present

## 2020-02-08 DIAGNOSIS — K5730 Diverticulosis of large intestine without perforation or abscess without bleeding: Secondary | ICD-10-CM | POA: Diagnosis not present

## 2020-02-08 DIAGNOSIS — R509 Fever, unspecified: Secondary | ICD-10-CM | POA: Diagnosis not present

## 2020-02-08 DIAGNOSIS — Z743 Need for continuous supervision: Secondary | ICD-10-CM | POA: Diagnosis not present

## 2020-02-08 DIAGNOSIS — F17210 Nicotine dependence, cigarettes, uncomplicated: Secondary | ICD-10-CM | POA: Diagnosis not present

## 2020-02-08 DIAGNOSIS — R531 Weakness: Secondary | ICD-10-CM | POA: Diagnosis not present

## 2020-02-08 DIAGNOSIS — R0902 Hypoxemia: Secondary | ICD-10-CM | POA: Diagnosis not present

## 2020-02-08 DIAGNOSIS — M1990 Unspecified osteoarthritis, unspecified site: Secondary | ICD-10-CM | POA: Diagnosis not present

## 2020-02-08 DIAGNOSIS — J449 Chronic obstructive pulmonary disease, unspecified: Secondary | ICD-10-CM | POA: Diagnosis not present

## 2020-02-08 DIAGNOSIS — M5489 Other dorsalgia: Secondary | ICD-10-CM | POA: Diagnosis not present

## 2020-02-08 DIAGNOSIS — N12 Tubulo-interstitial nephritis, not specified as acute or chronic: Secondary | ICD-10-CM | POA: Diagnosis not present

## 2020-02-09 DIAGNOSIS — J449 Chronic obstructive pulmonary disease, unspecified: Secondary | ICD-10-CM | POA: Diagnosis not present

## 2020-02-09 DIAGNOSIS — R531 Weakness: Secondary | ICD-10-CM | POA: Diagnosis not present

## 2020-02-09 DIAGNOSIS — N12 Tubulo-interstitial nephritis, not specified as acute or chronic: Secondary | ICD-10-CM | POA: Diagnosis not present

## 2020-02-09 DIAGNOSIS — R509 Fever, unspecified: Secondary | ICD-10-CM | POA: Diagnosis not present

## 2020-02-12 DIAGNOSIS — L959 Vasculitis limited to the skin, unspecified: Secondary | ICD-10-CM | POA: Diagnosis not present

## 2020-02-12 DIAGNOSIS — M25561 Pain in right knee: Secondary | ICD-10-CM | POA: Diagnosis not present

## 2020-02-12 DIAGNOSIS — M25562 Pain in left knee: Secondary | ICD-10-CM | POA: Diagnosis not present

## 2020-02-25 DIAGNOSIS — M25561 Pain in right knee: Secondary | ICD-10-CM | POA: Diagnosis not present

## 2020-02-25 DIAGNOSIS — Z79899 Other long term (current) drug therapy: Secondary | ICD-10-CM | POA: Diagnosis not present

## 2020-02-25 DIAGNOSIS — G8929 Other chronic pain: Secondary | ICD-10-CM | POA: Diagnosis not present

## 2020-02-25 DIAGNOSIS — M170 Bilateral primary osteoarthritis of knee: Secondary | ICD-10-CM | POA: Diagnosis not present

## 2020-02-25 DIAGNOSIS — M25562 Pain in left knee: Secondary | ICD-10-CM | POA: Diagnosis not present

## 2020-03-22 ENCOUNTER — Other Ambulatory Visit: Payer: Self-pay | Admitting: *Deleted

## 2020-03-22 DIAGNOSIS — M25561 Pain in right knee: Secondary | ICD-10-CM | POA: Diagnosis not present

## 2020-03-22 DIAGNOSIS — M25569 Pain in unspecified knee: Secondary | ICD-10-CM

## 2020-03-22 DIAGNOSIS — G8929 Other chronic pain: Secondary | ICD-10-CM | POA: Diagnosis not present

## 2020-03-22 DIAGNOSIS — M25562 Pain in left knee: Secondary | ICD-10-CM | POA: Diagnosis not present

## 2020-03-22 DIAGNOSIS — M25551 Pain in right hip: Secondary | ICD-10-CM | POA: Diagnosis not present

## 2020-03-22 DIAGNOSIS — Z79899 Other long term (current) drug therapy: Secondary | ICD-10-CM | POA: Diagnosis not present

## 2020-03-24 ENCOUNTER — Ambulatory Visit (INDEPENDENT_AMBULATORY_CARE_PROVIDER_SITE_OTHER): Payer: BC Managed Care – PPO | Admitting: Physician Assistant

## 2020-03-24 ENCOUNTER — Ambulatory Visit (HOSPITAL_COMMUNITY)
Admission: RE | Admit: 2020-03-24 | Discharge: 2020-03-24 | Disposition: A | Discharge status: Home / Self Care | Payer: BC Managed Care – PPO | Source: Ambulatory Visit | Attending: Vascular Surgery | Admitting: Vascular Surgery

## 2020-03-24 ENCOUNTER — Other Ambulatory Visit: Payer: Self-pay

## 2020-03-24 VITALS — BP 147/79 | HR 80 | Temp 96.9°F | Resp 16 | Ht 66.5 in | Wt 241.0 lb

## 2020-03-24 DIAGNOSIS — I872 Venous insufficiency (chronic) (peripheral): Secondary | ICD-10-CM | POA: Diagnosis not present

## 2020-03-24 DIAGNOSIS — M25569 Pain in unspecified knee: Secondary | ICD-10-CM | POA: Insufficient documentation

## 2020-03-24 NOTE — Progress Notes (Signed)
Requested by:  Alinda Deem, MD [address] [place],  [place] [address]  Reason for consultation: vasculitis of the skin   History of Present Illness   Ellen Bell is a 59 y.o. (02-Feb-1961) female who presents for evaluation of bilateral lower extremity redness and swelling. She states she has noticed her skin changes over the past two years- redness, firmness of her distal legs, tenderness to touch, itching and dry patches. Both of her legs bother her equally the same. She states that she does elevate but not frequently. She additionally in the past has worn TED hose which she states did help her legs. She has lost 50 lbs in the past year and she says she gets in her hot tub and exercises twice daily. She is anticipating right and then left total knee replacement in the upcoming months and her Orthopedic Surgeon as well as PCP recommended that she have her legs evaluated prior to proceeding with surgery  She does have grandmother with a lot of vein trouble. She otherwise has no other family history of venous insufficiency or DVT. She has no personal hx of DVT. She has not had any previous procedures done on her veins  She is a current smoker. She denies any claudication like symptoms, rest pain or non healing wounds  Venous symptoms include: (aching, heavy, tired, throbbing, burning, itching, swelling, bleeding, ulcer) heaviness, itching, swelling, redness Onset/duration: 2 years Occupation:  Multiple managerial roles  Aggravating factors: (sitting, standing) Alleviating factors: (elevation) Compression:  TED hose several years ago  Helps:  yes Pain medications:  Takes hydrocodone for joint pain Previous vein procedures:  no History of DVT:  no  Past Medical History:  Diagnosis Date  . Anxiety   . Arthritis   . Depression     Past Surgical History:  Procedure Laterality Date  . CESAREAN SECTION    . GALLBLADDER SURGERY      Social History    Socioeconomic History  . Marital status: Married    Spouse name: Not on file  . Number of children: Not on file  . Years of education: Not on file  . Highest education level: Not on file  Occupational History  . Not on file  Tobacco Use  . Smoking status: Current Every Day Smoker    Packs/day: 0.50    Years: 20.00    Pack years: 10.00  . Smokeless tobacco: Never Used  Substance and Sexual Activity  . Alcohol use: No  . Drug use: No  . Sexual activity: Not on file  Other Topics Concern  . Not on file  Social History Narrative  . Not on file   Social Determinants of Health   Financial Resource Strain:   . Difficulty of Paying Living Expenses:   Food Insecurity:   . Worried About Programme researcher, broadcasting/film/video in the Last Year:   . Barista in the Last Year:   Transportation Needs:   . Freight forwarder (Medical):   Marland Kitchen Lack of Transportation (Non-Medical):   Physical Activity:   . Days of Exercise per Week:   . Minutes of Exercise per Session:   Stress:   . Feeling of Stress :   Social Connections:   . Frequency of Communication with Friends and Family:   . Frequency of Social Gatherings with Friends and Family:   . Attends Religious Services:   . Active Member of Clubs or Organizations:   . Attends Club  or Organization Meetings:   Marland Kitchen Marital Status:   Intimate Partner Violence:   . Fear of Current or Ex-Partner:   . Emotionally Abused:   Marland Kitchen Physically Abused:   . Sexually Abused:     No family history on file.  Current Outpatient Medications  Medication Sig Dispense Refill  . esomeprazole (NEXIUM) 20 MG capsule Take 20 mg by mouth daily.    . FEROSUL 325 (65 Fe) MG tablet Take 325 mg by mouth 2 (two) times daily.    Marland Kitchen HYDROcodone-acetaminophen (NORCO) 7.5-325 MG tablet hydrocodone 7.5 mg-acetaminophen 325 mg tablet  TAKE 1 TABLET BY MOUTH FOUR TIMES DAILY    . Tiotropium Bromide-Olodaterol (STIOLTO RESPIMAT) 2.5-2.5 MCG/ACT AERS Stiolto Respimat 2.5 mcg-2.5  mcg/actuation solution for inhalation  INHALE 2 PUFFS BY MOUTH DAILY    . venlafaxine XR (EFFEXOR-XR) 150 MG 24 hr capsule Take 300 mg by mouth daily.     No current facility-administered medications for this visit.    Allergies  Allergen Reactions  . Iodine   . Sulfa Antibiotics     REVIEW OF SYSTEMS (negative unless checked):   Cardiac:  []  Chest pain or chest pressure? []  Shortness of breath upon activity? []  Shortness of breath when lying flat? []  Irregular heart rhythm?  Vascular:  []  Pain in calf, thigh, or hip brought on by walking? []  Pain in feet at night that wakes you up from your sleep? []  Blood clot in your veins? []  Leg swelling?  Pulmonary:  []  Oxygen at home? []  Productive cough? []  Wheezing?  Neurologic:  []  Sudden weakness in arms or legs? []  Sudden numbness in arms or legs? []  Sudden onset of difficult speaking or slurred speech? []  Temporary loss of vision in one eye? []  Problems with dizziness?  Gastrointestinal:  []  Blood in stool? []  Vomited blood?  Genitourinary:  []  Burning when urinating? []  Blood in urine?  Psychiatric:  []  Major depression  Hematologic:  []  Bleeding problems? []  Problems with blood clotting?  Dermatologic:  []  Rashes or ulcers?  Constitutional:  []  Fever or chills?  Ear/Nose/Throat:  []  Change in hearing? []  Nose bleeds? []  Sore throat?  Musculoskeletal:  []  Back pain? [x]  Joint pain? Bilateral knee pain []  Muscle pain?    Physical Examination     Vitals:   03/24/20 1438  BP: (!) 147/79  Pulse: 80  Resp: 16  Temp: (!) 96.9 F (36.1 C)  TempSrc: Temporal  SpO2: 96%  Weight: 241 lb (109.3 kg)  Height: 5' 6.5" (1.689 m)   Body mass index is 38.32 kg/m.  General:  WDWN in NAD; vital signs documented above Gait: Normal, uses cane HENT: WNL, normocephalic Pulmonary: normal non-labored breathing without wheezing Cardiac: regular HR, without  Murmurs without carotid bruit Abdomen: soft,  NT, no masses Skin: without rashes Vascular Exam/Pulses:  Right Left  Radial 2+ (normal) 2+ (normal)  Ulnar 2+ (normal) 2+ (normal)  Femoral 2+ (normal) 2+ (normal)  Popliteal Not palpable Not palpable  DP 2+ (normal) 2+ (normal)  PT 2+ (normal) 2+ (normal)   Extremities: bilateral lower extremities with varicose veins mostly in the proximal medio posterior legs, reticular veins of bilateral ankles and feet, without edema, with stasis erythematous pigmentation of the mid to distal lower legs, with lipodermatosclerosis of bilateral lower extremities, without ulcers Musculoskeletal: no muscle wasting or atrophy  Neurologic: A&O X 3;  No focal weakness or paresthesias are detected Psychiatric:  The pt has Normal affect.  Non-invasive Vascular Imaging   BLE  Venous Insufficiency Duplex (03/24/20):   RLE:    DVT and SVT   GSV reflux at [facility]  GSV diameter 0.22-0.53  No SSV reflux   deep venous reflux in popliteal vein  Large anterior accessory branch without reflux  LLE:   DVT and SVT  No GSV reflux   GSV diameter 0.31-0.64  SSV reflux   deep venous reflux in common femoral vein  Large anterior accessory branch with reflux   Medical Decision Making   Ellen Bell is a 59 y.o. female who presents with: BLE chronic venous insufficiency, varicose veins with complications. She is considered a C4b CEAP classification. Based on non invasive studies patient would not be candidate for venous ablation. She does have some varicosities that she possible could have sclerotherapy or stab phlebectomy for however she would prefer to hold off on any procedure at this time due to anticipating bilateral total knee replacements   Based on the patient's history and examination, I recommend conservative therapy with exercise, weight reduction, elevation and thigh high compression stockings.   I discussed with the patient the use of her 20-30 mm thigh high compression stockings and need  for 3 month trial of such.  She is okay to proceed with total knee replacements from vascular standpoint and I have discussed with her continued compression stocking use or use of TED hose post operatively to prevent DVT and swelling  She will follow up with as needed if she has new issues or concerns or if she decides in the future to have treatment of her varicose veins   Karoline Caldwell, PA-C [facility]: [phone]  03/24/2020, 2:41 PM  Clinic MD: Dr. Oneida Alar

## 2020-04-01 DIAGNOSIS — M25561 Pain in right knee: Secondary | ICD-10-CM | POA: Diagnosis not present

## 2020-04-23 DIAGNOSIS — M25551 Pain in right hip: Secondary | ICD-10-CM | POA: Diagnosis not present

## 2020-04-23 DIAGNOSIS — M25561 Pain in right knee: Secondary | ICD-10-CM | POA: Diagnosis not present

## 2020-04-23 DIAGNOSIS — M25562 Pain in left knee: Secondary | ICD-10-CM | POA: Diagnosis not present

## 2020-04-23 DIAGNOSIS — G8929 Other chronic pain: Secondary | ICD-10-CM | POA: Diagnosis not present

## 2020-04-30 ENCOUNTER — Ambulatory Visit: Payer: Self-pay | Admitting: Orthopedic Surgery

## 2020-05-14 NOTE — Progress Notes (Signed)
DUE TO COVID-19 ONLY ONE VISITOR IS ALLOWED TO COME WITH YOU AND STAY IN THE WAITING ROOM ONLY DURING PRE OP AND PROCEDURE DAY OF SURGERY. THE 1 VISITOR MAY VISIT WITH YOU AFTER SURGERY IN YOUR PRIVATE ROOM DURING VISITING HOURS ONLY!  YOU NEED TO HAVE A COVID 19 TEST ON_______ @_______ , THIS TEST MUST BE DONE BEFORE SURGERY, COME  [address], [place] , .  [facility]) ONCE YOUR COVID TEST IS COMPLETED, PLEASE BEGIN THE QUARANTINE INSTRUCTIONS AS OUTLINED IN YOUR HANDOUT.                Ellen Bell  05/14/2020   Your procedure is scheduled on:                 05/26/20   Report to [facility] Main  Entrance   Report to admitting at    0600 AM     Call this number if you have problems the morning of surgery [phone]    Remember: Do not eat food    :After Midnight. BRUSH YOUR TEETH MORNING OF SURGERY AND RINSE YOUR MOUTH OUT, NO CHEWING GUM CANDY OR MINTS.     Take these medicines the morning of surgery with A SIP OF WATER:  Nexium, Inhaler as usual, effexor                                 You may not have any metal on your body including hair pins and              piercings  Do not wear jewelry, make-up, lotions, powders or perfumes, deodorant             Do not wear nail polish on your fingernails.  Do not shave  48 hours prior to surgery.     Do not bring valuables to the hospital. [facility] IS NOT             RESPONSIBLE   FOR VALUABLES.  Contacts, dentures or bridgework may not be worn into surgery.  Leave suitcase in the car. After surgery it may be brought to your room.     Patients discharged the day of surgery will not be allowed to drive home. IF YOU ARE HAVING SURGERY AND GOING HOME THE SAME DAY, YOU MUST HAVE AN ADULT TO DRIVE YOU HOME AND BE WITH YOU FOR 24 HOURS. YOU MAY GO HOME BY TAXI OR UBER OR ORTHERWISE, BUT AN ADULT MUST ACCOMPANY YOU HOME AND STAY WITH YOU FOR 24 HOURS.  Name and phone number of your driver:                Please read over the following fact sheets you were given: _____________________________________________________________________             NO SOLID FOOD AFTER MIDNIGHT THE NIGHT PRIOR TO SURGERY. NOTHING BY MOUTH EXCEPT CLEAR LIQUIDS UNTIL   0530am . PLEASE FINISH ENSURE DRINK PER SURGEON ORDER  WHICH NEEDS TO BE COMPLETED AT 0530am .   CLEAR LIQUID DIET   Foods Allowed  Foods Excluded  Coffee and tea, regular and decaf                             liquids that you cannot  Plain Jell-O any favor except red or purple                                           see through such as: Fruit ices (not with fruit pulp)                                     milk, soups, orange juice  Iced Popsicles                                    All solid food Carbonated beverages, regular and diet                                    Cranberry, grape and apple juices Sports drinks like Gatorade Lightly seasoned clear broth or consume(fat free) Sugar, honey syrup                _____________________________________________________________________  [facility] - Preparing for Surgery Before surgery, you can play an important role.  Because skin is not sterile, your skin needs to be as free of germs as possible.  You can reduce the number of germs on your skin by washing with CHG (chlorahexidine gluconate) soap before surgery.  CHG is an antiseptic cleaner which kills germs and bonds with the skin to continue killing germs even after washing. Please DO NOT use if you have an allergy to CHG or antibacterial soaps.  If your skin becomes reddened/irritated stop using the CHG and inform your nurse when you arrive at Short Stay. Do not shave (including legs and underarms) for at least 48 hours prior to the first CHG shower.  You may shave your face/neck. Please follow these instructions carefully:  1.  Shower with CHG Soap the night before  surgery and the  morning of Surgery.  2.  If you choose to wash your hair, wash your hair first as usual with your  normal  shampoo.  3.  After you shampoo, rinse your hair and body thoroughly to remove the  shampoo.                           4.  Use CHG as you would any other liquid soap.  You can apply chg directly  to the skin and wash                       Gently with a scrungie or clean washcloth.  5.  Apply the CHG Soap to your body ONLY FROM THE NECK DOWN.   Do not use on face/ open                           Wound or open sores. Avoid contact with eyes, ears mouth and genitals (private parts).  Wash face,  Genitals (private parts) with your normal soap.             6.  Wash thoroughly, paying special attention to the area where your surgery  will be performed.  7.  Thoroughly rinse your body with warm water from the neck down.  8.  DO NOT shower/wash with your normal soap after using and rinsing off  the CHG Soap.                9.  Pat yourself dry with a clean towel.            10.  Wear clean pajamas.            11.  Place clean sheets on your bed the night of your first shower and do not  sleep with pets. Day of Surgery : Do not apply any lotions/deodorants the morning of surgery.  Please wear clean clothes to the hospital/surgery center.  FAILURE TO FOLLOW THESE INSTRUCTIONS MAY RESULT IN THE CANCELLATION OF YOUR SURGERY PATIENT SIGNATURE_________________________________  NURSE SIGNATURE__________________________________  ________________________________________________________________________   Ellen Bell  An incentive spirometer is a tool that can help keep your lungs clear and active. This tool measures how well you are filling your lungs with each breath. Taking long deep breaths may help reverse or decrease the chance of developing breathing (pulmonary) problems (especially infection) following:  A long period of time when you are unable to  move or be active. BEFORE THE PROCEDURE   If the spirometer includes an indicator to show your best effort, your nurse or respiratory therapist will set it to a desired goal.  If possible, sit up straight or lean slightly forward. Try not to slouch.  Hold the incentive spirometer in an upright position. INSTRUCTIONS FOR USE  1. Sit on the edge of your bed if possible, or sit up as far as you can in bed or on a chair. 2. Hold the incentive spirometer in an upright position. 3. Breathe out normally. 4. Place the mouthpiece in your mouth and seal your lips tightly around it. 5. Breathe in slowly and as deeply as possible, raising the piston or the ball toward the top of the column. 6. Hold your breath for 3-5 seconds or for as long as possible. Allow the piston or ball to fall to the bottom of the column. 7. Remove the mouthpiece from your mouth and breathe out normally. 8. Rest for a few seconds and repeat Steps 1 through 7 at least 10 times every 1-2 hours when you are awake. Take your time and take a few normal breaths between deep breaths. 9. The spirometer may include an indicator to show your best effort. Use the indicator as a goal to work toward during each repetition. 10. After each set of 10 deep breaths, practice coughing to be sure your lungs are clear. If you have an incision (the cut made at the time of surgery), support your incision when coughing by placing a pillow or rolled up towels firmly against it. Once you are able to get out of bed, walk around indoors and cough well. You may stop using the incentive spirometer when instructed by your caregiver.  RISKS AND COMPLICATIONS  Take your time so you do not get dizzy or light-headed.  If you are in pain, you may need to take or ask for pain medication before doing incentive spirometry. It is harder to take a deep breath if you are having pain.  AFTER USE  Rest and breathe slowly and easily.  It can be helpful to keep track of  a log of your progress. Your caregiver can provide you with a simple table to help with this. If you are using the spirometer at home, follow these instructions: SEEK MEDICAL CARE IF:   You are having difficultly using the spirometer.  You have trouble using the spirometer as often as instructed.  Your pain medication is not giving enough relief while using the spirometer.  You develop fever of 100.5 F (38.1 C) or higher. SEEK IMMEDIATE MEDICAL CARE IF:   You cough up bloody sputum that had not been present before.  You develop fever of 102 F (38.9 C) or greater.  You develop worsening pain at or near the incision site. MAKE SURE YOU:   Understand these instructions.  Will watch your condition.  Will get help right away if you are not doing well or get worse. Document Released: 03/05/2007 Document Revised: 01/15/2012 Document Reviewed: 05/06/2007 [facility] Patient Information 2014 [place], [place].   ________________________________________________________________________

## 2020-05-18 ENCOUNTER — Ambulatory Visit: Payer: Self-pay | Admitting: Orthopedic Surgery

## 2020-05-18 NOTE — H&P (View-Only) (Signed)
Ellen Bell is an 59 y.o. female.   °Chief Complaint: R knee pain °HPI: Ellen Bell comes in today for her history and physical. She is scheduled for a right total knee arthroplasty on 05/26/2020 at [facility] by Dr. Jeffrey Beane. °She has failed conservative treatment and has ongoing symptoms interfering with quality-of-life and activities of daily living and desires to proceed with replacement. She has failed injection therapy and is a prior history of arthroscopy. She has continued her weight loss efforts and her BMI is now at 38. °Dr. Beane and the patient mutually agreed to proceed with a right total knee replacement. Risks and benefits of the procedure were discussed including stiffness, suboptimal range of motion, persistent pain, infection requiring removal of prosthesis and reinsertion, need for prophylactic antibiotics in the future, for example, dental procedures, possible need for manipulation, revision in the future and also anesthetic complications including DVT, PE, etc. We discussed the perioperative course, time in the hospital, postoperative recovery and the need for elevation to control swelling. We also discussed the predicted range of motion and the probability that squatting and kneeling would be unobtainable in the future. In addition, postoperative anticoagulation was discussed. We have obtained preoperative medical clearance as necessary. Provided illustrated handout and discussed it in detail. They will enroll in the total joint replacement educational forum at the hospital. °She does have a prior history of gastric bypass. She has no history of DVT. She would like to do outpatient PT local to her in [place]. ° °Past Medical History:  °Diagnosis Date  °• Anxiety   °• Arthritis   °• Depression   ° ° °Past Surgical History:  °Procedure Laterality Date  °• CESAREAN SECTION    °• GALLBLADDER SURGERY    ° ° °No family history on file. °Social History:  reports that she has been  smoking. She has a 10.00 pack-year smoking history. She has never used smokeless tobacco. She reports that she does not drink alcohol and does not use drugs. ° °Allergies:  °Allergies  °Allergen Reactions  °• Sulfa Antibiotics Anaphylaxis  °• Iodine Rash  ° ° °Medications: °esomeprazole magnesium 20 mg capsule,delayed release °FeroSuL 325 mg (65 mg iron) tablet °HYDROcodone 7.5 mg-acetaminophen 325 mg tablet °meloxicam 15 mg tablet °simvastatin 40 mg tablet °Stiolto Respimat 2.5 mcg-2.5 mcg/actuation solution for inhalation °venlafaxine ER 150 mg capsule,extended release 24 hr ° °Review of Systems  °Constitutional: Negative.   °HENT: Negative.   °Eyes: Negative.   °Respiratory: Negative.   °Gastrointestinal: Negative.   °Endocrine: Negative.   °Genitourinary: Negative.   °Musculoskeletal: Positive for arthralgias and joint swelling.  °Skin: Negative.   °Neurological: Negative.   ° ° °There were no vitals taken for this visit. °Physical Exam °Constitutional:   °   Appearance: Normal appearance.  °HENT:  °   Head: Normocephalic.  °   Right Ear: External ear normal.  °   Left Ear: External ear normal.  °   Nose: Nose normal.  °   Mouth/Throat:  °   Mouth: Mucous membranes are moist.  °Cardiovascular:  °   Rate and Rhythm: Normal rate and regular rhythm.  °   Pulses: Normal pulses.  °   Heart sounds: Normal heart sounds.  °Pulmonary:  °   Effort: Pulmonary effort is normal.  °   Breath sounds: Normal breath sounds.  °Abdominal:  °   General: Abdomen is flat.  °Musculoskeletal:  °   Cervical back: Normal range of motion and neck   supple.  °   Comments: Patient is a 59-year-old female. ° °On examination of the right knee, she is tender to palpation medial joint line. Pain with Patellofemoral pain compression. Trace effusion. Ranges -10-110. Slight varus deformity. No DVT. Hips lateral hip and ankle exams unremarkable. °Antalgic gait, use of a cane. °Prior x-rays with end-stage degenerative changes right knee, bone-on-bone  medially. Varus deformity. °  °Skin: °   General: Skin is warm and dry.  °Neurological:  °   Mental Status: She is alert.  ° °  ° °Assessment/Plan °Impression: End-stage right knee osteoarthritis ° °Plan:Pt with end-stage right knee DJD, bone-on-bone, refractory to conservative tx, scheduled for right total knee replacement by Dr. Beane on 05-26-20. We again discussed the procedure itself as well as risks, complications and alternatives, including but not limited to DVT, PE, infx, bleeding, failure of procedure, need for secondary procedure including manipulation, nerve injury, ongoing pain/symptoms, anesthesia risk, even stroke or death. Also discussed typical post-op protocols, activity restrictions, need for PT, flexion/extension exercises, time out of work. Discussed need for DVT ppx post-op per protocol. Discussed dental ppx and infx prevention. Also discussed limitations post-operatively such as kneeling and squatting. All questions were answered. Patient desires to proceed with surgery as scheduled. ° °Will hold supplements, ASA and NSAIDs accordingly. Will remain NPO after midnight the night before surgery. Will present to [facility] for pre-op testing. Anticipate hospital stay to include at least 2 midnights given medical history and to ensure proper pain control. Plan aspirin 81 mg for DVT ppx post-op. Plan Percocet short-term before she is able to get back to her chronic hydrocodone, Robaxin, Colace, Miralax. Plan outpatient physical therapy immediately postop. Will follow up 10-14 days post-op for suture removal and xrays. ° °Plan Right total knee arthroplasty ° °Pinchus Weckwerth M Akya Fiorello, PA-C for Dr. Beane °05/18/2020, 2:44 PM ° ° °

## 2020-05-18 NOTE — H&P (Signed)
Ellen Bell is an 59 y.o. female.   Chief Complaint: R knee pain HPI: Ms. Leist comes in today for Ellen Bell history and physical. Ellen Bell is scheduled for a right total knee arthroplasty on 05/26/2020 at [facility] by Dr. Jene Every. Ellen Bell has failed conservative treatment and has ongoing symptoms interfering with quality-of-life and activities of daily living and desires to proceed with replacement. Ellen Bell has failed injection therapy and is a prior history of arthroscopy. Ellen Bell has continued Ellen Bell weight loss efforts and Ellen Bell BMI is now at 38. Dr. Shelle Iron and the patient mutually agreed to proceed with a right total knee replacement. Risks and benefits of the procedure were discussed including stiffness, suboptimal range of motion, persistent pain, infection requiring removal of prosthesis and reinsertion, need for prophylactic antibiotics in the future, for example, dental procedures, possible need for manipulation, revision in the future and also anesthetic complications including DVT, PE, etc. We discussed the perioperative course, time in the hospital, postoperative recovery and the need for elevation to control swelling. We also discussed the predicted range of motion and the probability that squatting and kneeling would be unobtainable in the future. In addition, postoperative anticoagulation was discussed. We have obtained preoperative medical clearance as necessary. Provided illustrated handout and discussed it in detail. They will enroll in the total joint replacement educational forum at the hospital. Ellen Bell does have a prior history of gastric bypass. Ellen Bell has no history of DVT. Ellen Bell would like to do outpatient PT local to Ellen Bell in [place].  Past Medical History:  Diagnosis Date   Anxiety    Arthritis    Depression     Past Surgical History:  Procedure Laterality Date   CESAREAN SECTION     GALLBLADDER SURGERY      No family history on file. Social History:  reports that Ellen Bell has been  smoking. Ellen Bell has a 10.00 pack-year smoking history. Ellen Bell has never used smokeless tobacco. Ellen Bell reports that Ellen Bell does not drink alcohol and does not use drugs.  Allergies:  Allergies  Allergen Reactions   Sulfa Antibiotics Anaphylaxis   Iodine Rash    Medications: esomeprazole magnesium 20 mg capsule,delayed release FeroSuL 325 mg (65 mg iron) tablet HYDROcodone 7.5 mg-acetaminophen 325 mg tablet meloxicam 15 mg tablet simvastatin 40 mg tablet Stiolto Respimat 2.5 mcg-2.5 mcg/actuation solution for inhalation venlafaxine ER 150 mg capsule,extended release 24 hr  Review of Systems  Constitutional: Negative.   HENT: Negative.   Eyes: Negative.   Respiratory: Negative.   Gastrointestinal: Negative.   Endocrine: Negative.   Genitourinary: Negative.   Musculoskeletal: Positive for arthralgias and joint swelling.  Skin: Negative.   Neurological: Negative.     There were no vitals taken for this visit. Physical Exam Constitutional:      Appearance: Normal appearance.  HENT:     Head: Normocephalic.     Right Ear: External ear normal.     Left Ear: External ear normal.     Nose: Nose normal.     Mouth/Throat:     Mouth: Mucous membranes are moist.  Cardiovascular:     Rate and Rhythm: Normal rate and regular rhythm.     Pulses: Normal pulses.     Heart sounds: Normal heart sounds.  Pulmonary:     Effort: Pulmonary effort is normal.     Breath sounds: Normal breath sounds.  Abdominal:     General: Abdomen is flat.  Musculoskeletal:     Cervical back: Normal range of motion and neck  supple.     Comments: Patient is a 59 year old female.  On examination of the right knee, Ellen Bell is tender to palpation medial joint line. Pain with Patellofemoral pain compression. Trace effusion. Ranges -10-110. Slight varus deformity. No DVT. Hips lateral hip and ankle exams unremarkable. Antalgic gait, use of a cane. Prior x-rays with end-stage degenerative changes right knee, bone-on-bone  medially. Varus deformity.   Skin:    General: Skin is warm and dry.  Neurological:     Mental Status: Ellen Bell is alert.      Assessment/Plan Impression: End-stage right knee osteoarthritis  Plan:Pt with end-stage right knee DJD, bone-on-bone, refractory to conservative tx, scheduled for right total knee replacement by Dr. Shelle Iron on 05-26-20. We again discussed the procedure itself as well as risks, complications and alternatives, including but not limited to DVT, PE, infx, bleeding, failure of procedure, need for secondary procedure including manipulation, nerve injury, ongoing pain/symptoms, anesthesia risk, even stroke or death. Also discussed typical post-op protocols, activity restrictions, need for PT, flexion/extension exercises, time out of work. Discussed need for DVT ppx post-op per protocol. Discussed dental ppx and infx prevention. Also discussed limitations post-operatively such as kneeling and squatting. All questions were answered. Patient desires to proceed with surgery as scheduled.  Will hold supplements, ASA and NSAIDs accordingly. Will remain NPO after midnight the night before surgery. Will present to [facility] for pre-op testing. Anticipate hospital stay to include at least 2 midnights given medical history and to ensure proper pain control. Plan aspirin 81 mg for DVT ppx post-op. Plan Percocet short-term before Ellen Bell is able to get back to Ellen Bell chronic hydrocodone, Robaxin, Colace, Miralax. Plan outpatient physical therapy immediately postop. Will follow up 10-14 days post-op for suture removal and xrays.  Plan Right total knee arthroplasty  Dorothy Spark, PA-C for Dr. Shelle Iron 05/18/2020, 2:44 PM

## 2020-05-19 ENCOUNTER — Encounter (HOSPITAL_COMMUNITY): Payer: Self-pay

## 2020-05-19 ENCOUNTER — Encounter (INDEPENDENT_AMBULATORY_CARE_PROVIDER_SITE_OTHER): Payer: Self-pay

## 2020-05-19 ENCOUNTER — Encounter (HOSPITAL_COMMUNITY)
Admission: RE | Admit: 2020-05-19 | Discharge: 2020-05-19 | Disposition: A | Discharge status: Home / Self Care | Payer: BC Managed Care – PPO | Source: Ambulatory Visit | Attending: Specialist | Admitting: Specialist

## 2020-05-19 ENCOUNTER — Other Ambulatory Visit: Payer: Self-pay

## 2020-05-19 DIAGNOSIS — Z01812 Encounter for preprocedural laboratory examination: Secondary | ICD-10-CM | POA: Insufficient documentation

## 2020-05-19 HISTORY — DX: Pneumonia, unspecified organism: J18.9

## 2020-05-19 LAB — CBC
HCT: 48.1 % — ABNORMAL HIGH (ref 36.0–46.0)
Hemoglobin: 15.2 g/dL — ABNORMAL HIGH (ref 12.0–15.0)
MCH: 29.9 pg (ref 26.0–34.0)
MCHC: 31.6 g/dL (ref 30.0–36.0)
MCV: 94.5 fL (ref 80.0–100.0)
Platelets: 205 10*3/uL (ref 150–400)
RBC: 5.09 MIL/uL (ref 3.87–5.11)
RDW: 14.3 % (ref 11.5–15.5)
WBC: 7.3 10*3/uL (ref 4.0–10.5)
nRBC: 0 % (ref 0.0–0.2)

## 2020-05-19 LAB — URINALYSIS, ROUTINE W REFLEX MICROSCOPIC
Bacteria, UA: NONE SEEN
Bilirubin Urine: NEGATIVE
Glucose, UA: NEGATIVE mg/dL
Ketones, ur: NEGATIVE mg/dL
Leukocytes,Ua: NEGATIVE
Nitrite: NEGATIVE
Protein, ur: NEGATIVE mg/dL
Specific Gravity, Urine: 1.006 (ref 1.005–1.030)
pH: 6 (ref 5.0–8.0)

## 2020-05-19 LAB — BASIC METABOLIC PANEL
Anion gap: 10 (ref 5–15)
BUN: 14 mg/dL (ref 6–20)
CO2: 29 mmol/L (ref 22–32)
Calcium: 9.2 mg/dL (ref 8.9–10.3)
Chloride: 105 mmol/L (ref 98–111)
Creatinine, Ser: 0.53 mg/dL (ref 0.44–1.00)
GFR calc Af Amer: >60 mL/min (ref >60)
GFR calc non Af Amer: >60 mL/min (ref >60)
Glucose, Bld: 88 mg/dL (ref 70–99)
Potassium: 4 mmol/L (ref 3.5–5.1)
Sodium: 144 mmol/L (ref 135–145)

## 2020-05-19 LAB — APTT: aPTT: 33 seconds (ref 24–36)

## 2020-05-19 LAB — PROTIME-INR
INR: 1 (ref 0.8–1.2)
Prothrombin Time: 12.4 seconds (ref 11.4–15.2)

## 2020-05-19 LAB — SURGICAL PCR SCREEN
MRSA, PCR: NEGATIVE
Staphylococcus aureus: POSITIVE — AB

## 2020-05-19 NOTE — Progress Notes (Signed)
U/A done 05/19/20 routed via epic to Dr Shelle Iron.

## 2020-05-20 NOTE — Progress Notes (Signed)
pcr done 05/19/20 routed via epic to Dr Shelle Iron.

## 2020-05-21 DIAGNOSIS — M25562 Pain in left knee: Secondary | ICD-10-CM | POA: Diagnosis not present

## 2020-05-21 DIAGNOSIS — G8929 Other chronic pain: Secondary | ICD-10-CM | POA: Diagnosis not present

## 2020-05-21 DIAGNOSIS — M25561 Pain in right knee: Secondary | ICD-10-CM | POA: Diagnosis not present

## 2020-05-22 ENCOUNTER — Other Ambulatory Visit (HOSPITAL_COMMUNITY)
Admission: RE | Admit: 2020-05-22 | Discharge: 2020-05-22 | Disposition: A | Discharge status: Home / Self Care | Payer: BC Managed Care – PPO | Source: Ambulatory Visit | Attending: Specialist | Admitting: Specialist

## 2020-05-22 DIAGNOSIS — Z20822 Contact with and (suspected) exposure to covid-19: Secondary | ICD-10-CM | POA: Insufficient documentation

## 2020-05-22 DIAGNOSIS — Z01812 Encounter for preprocedural laboratory examination: Secondary | ICD-10-CM | POA: Diagnosis not present

## 2020-05-22 LAB — SARS CORONAVIRUS 2 (TAT 6-24 HRS): SARS Coronavirus 2: NEGATIVE

## 2020-05-25 NOTE — Anesthesia Preprocedure Evaluation (Addendum)
Anesthesia Evaluation  Patient identified by MRN, date of birth, ID band Patient awake    Reviewed: Allergy & Precautions, NPO status , Patient's Chart, lab work & pertinent test results  Airway Mallampati: II  TM Distance: >3 FB Neck ROM: Full    Dental  (+) Teeth Intact, Dental Advisory Given   Pulmonary Current Smoker and Patient abstained from smoking.,    Pulmonary exam normal breath sounds clear to auscultation       Cardiovascular negative cardio ROS Normal cardiovascular exam Rhythm:Regular Rate:Normal     Neuro/Psych PSYCHIATRIC DISORDERS Anxiety Depression negative neurological ROS     GI/Hepatic Neg liver ROS, GERD  Medicated,  Endo/Other  Obesity   Renal/GU negative Renal ROS     Musculoskeletal  (+) Arthritis , Osteoarthritis,    Abdominal   Peds  Hematology negative hematology ROS (+) Plt 205k   Anesthesia Other Findings   Reproductive/Obstetrics                            Anesthesia Physical Anesthesia Plan  ASA: II  Anesthesia Plan: Spinal   Post-op Pain Management:  Regional for Post-op pain   Induction: Intravenous  PONV Risk Score and Plan: 1 and Propofol infusion, Treatment may vary due to age or medical condition and Midazolam  Airway Management Planned: Natural Airway and Nasal Cannula  Additional Equipment:   Intra-op Plan:   Post-operative Plan:   Informed Consent: I have reviewed the patients History and Physical, chart, labs and discussed the procedure including the risks, benefits and alternatives for the proposed anesthesia with the patient or authorized representative who has indicated his/her understanding and acceptance.     Dental advisory given  Plan Discussed with: CRNA  Anesthesia Plan Comments:        Anesthesia Quick Evaluation

## 2020-05-26 ENCOUNTER — Encounter (HOSPITAL_COMMUNITY)
Admission: RE | Disposition: A | Discharge status: Home / Self Care | Payer: Self-pay | Source: Home / Self Care | Attending: Specialist

## 2020-05-26 ENCOUNTER — Inpatient Hospital Stay (HOSPITAL_COMMUNITY): Payer: BC Managed Care – PPO | Admitting: Anesthesiology

## 2020-05-26 ENCOUNTER — Encounter (HOSPITAL_COMMUNITY): Payer: Self-pay | Admitting: Specialist

## 2020-05-26 ENCOUNTER — Inpatient Hospital Stay (HOSPITAL_COMMUNITY)
Admission: RE | Admit: 2020-05-26 | Discharge: 2020-05-29 | DRG: 470 | Disposition: A | Discharge status: Home / Self Care | Payer: BC Managed Care – PPO | Attending: Specialist | Admitting: Specialist

## 2020-05-26 ENCOUNTER — Inpatient Hospital Stay (HOSPITAL_COMMUNITY): Payer: BC Managed Care – PPO

## 2020-05-26 ENCOUNTER — Other Ambulatory Visit: Payer: Self-pay

## 2020-05-26 DIAGNOSIS — Z888 Allergy status to other drugs, medicaments and biological substances status: Secondary | ICD-10-CM | POA: Diagnosis not present

## 2020-05-26 DIAGNOSIS — F17210 Nicotine dependence, cigarettes, uncomplicated: Secondary | ICD-10-CM | POA: Diagnosis present

## 2020-05-26 DIAGNOSIS — Z6838 Body mass index (BMI) 38.0-38.9, adult: Secondary | ICD-10-CM

## 2020-05-26 DIAGNOSIS — Z9884 Bariatric surgery status: Secondary | ICD-10-CM

## 2020-05-26 DIAGNOSIS — M1711 Unilateral primary osteoarthritis, right knee: Secondary | ICD-10-CM | POA: Diagnosis present

## 2020-05-26 DIAGNOSIS — Z471 Aftercare following joint replacement surgery: Secondary | ICD-10-CM | POA: Diagnosis not present

## 2020-05-26 DIAGNOSIS — E669 Obesity, unspecified: Secondary | ICD-10-CM | POA: Diagnosis present

## 2020-05-26 DIAGNOSIS — F329 Major depressive disorder, single episode, unspecified: Secondary | ICD-10-CM | POA: Diagnosis present

## 2020-05-26 DIAGNOSIS — Z96659 Presence of unspecified artificial knee joint: Secondary | ICD-10-CM

## 2020-05-26 DIAGNOSIS — Z882 Allergy status to sulfonamides status: Secondary | ICD-10-CM | POA: Diagnosis not present

## 2020-05-26 DIAGNOSIS — Z96651 Presence of right artificial knee joint: Secondary | ICD-10-CM | POA: Diagnosis not present

## 2020-05-26 HISTORY — PX: TOTAL KNEE ARTHROPLASTY: SHX125

## 2020-05-26 LAB — TYPE AND SCREEN
ABO/RH(D): O NEG
Antibody Screen: NEGATIVE

## 2020-05-26 LAB — POCT I-STAT, CHEM 8
BUN: 11 mg/dL (ref 6–20)
Calcium, Ion: 1.36 mmol/L (ref 1.15–1.40)
Chloride: 103 mmol/L (ref 98–111)
Creatinine, Ser: 0.5 mg/dL (ref 0.44–1.00)
Glucose, Bld: 120 mg/dL — ABNORMAL HIGH (ref 70–99)
HCT: 36 % (ref 36.0–46.0)
Hemoglobin: 12.2 g/dL (ref 12.0–15.0)
Potassium: 4 mmol/L (ref 3.5–5.1)
Sodium: 143 mmol/L (ref 135–145)
TCO2: 30 mmol/L (ref 22–32)

## 2020-05-26 LAB — ABO/RH: ABO/RH(D): O NEG

## 2020-05-26 SURGERY — ARTHROPLASTY, KNEE, TOTAL
Anesthesia: Spinal | Site: Knee | Laterality: Right

## 2020-05-26 MED ORDER — MIDAZOLAM HCL 2 MG/2ML IJ SOLN
INTRAMUSCULAR | Status: AC
  Filled 2020-05-26: qty 2

## 2020-05-26 MED ORDER — TRANEXAMIC ACID-NACL 1000-0.7 MG/100ML-% IV SOLN
1000.0000 mg | INTRAVENOUS | Status: AC
  Administered 2020-05-26: 1000 mg via INTRAVENOUS
  Filled 2020-05-26: qty 100

## 2020-05-26 MED ORDER — FENTANYL CITRATE (PF) 100 MCG/2ML IJ SOLN
INTRAMUSCULAR | Status: AC
  Filled 2020-05-26: qty 2

## 2020-05-26 MED ORDER — DOCUSATE SODIUM 100 MG PO CAPS
100.0000 mg | ORAL_CAPSULE | Freq: Two times a day (BID) | ORAL | Status: DC
  Administered 2020-05-26 – 2020-05-29 (×6): 100 mg via ORAL
  Filled 2020-05-26 (×6): qty 1

## 2020-05-26 MED ORDER — ALUM & MAG HYDROXIDE-SIMETH 200-200-20 MG/5ML PO SUSP: 30.0000 mL | ORAL | Status: DC | PRN

## 2020-05-26 MED ORDER — BUPIVACAINE IN DEXTROSE 0.75-8.25 % IT SOLN
INTRATHECAL | Status: DC | PRN
Start: 2020-05-26 — End: 2020-05-26
  Administered 2020-05-26: 1.8 mL via INTRATHECAL

## 2020-05-26 MED ORDER — DEXAMETHASONE SODIUM PHOSPHATE 10 MG/ML IJ SOLN
INTRAMUSCULAR | Status: DC | PRN
  Administered 2020-05-26: 10 mg via INTRAVENOUS

## 2020-05-26 MED ORDER — MENTHOL 3 MG MT LOZG: 1.0000 | LOZENGE | OROMUCOSAL | Status: DC | PRN

## 2020-05-26 MED ORDER — SODIUM CHLORIDE 0.9 % IR SOLN
Status: DC | PRN
  Administered 2020-05-26: 2000 mL

## 2020-05-26 MED ORDER — LIDOCAINE 2% (20 MG/ML) 5 ML SYRINGE
INTRAMUSCULAR | Status: DC | PRN
  Administered 2020-05-26: 100 mg via INTRAVENOUS

## 2020-05-26 MED ORDER — ROPIVACAINE HCL 7.5 MG/ML IJ SOLN
INTRAMUSCULAR | Status: DC | PRN
  Administered 2020-05-26: 20 mL via PERINEURAL

## 2020-05-26 MED ORDER — MIDAZOLAM HCL 5 MG/5ML IJ SOLN
INTRAMUSCULAR | Status: DC | PRN
  Administered 2020-05-26: 2 mg via INTRAVENOUS

## 2020-05-26 MED ORDER — THROMBIN (RECOMBINANT) 5000 UNITS EX SOLR
CUTANEOUS | Status: AC
  Filled 2020-05-26: qty 5000

## 2020-05-26 MED ORDER — STERILE WATER FOR IRRIGATION IR SOLN
Status: DC | PRN
  Administered 2020-05-26: 2000 mL

## 2020-05-26 MED ORDER — SODIUM CHLORIDE 0.9 % IV SOLN
INTRAVENOUS | Status: AC
  Filled 2020-05-26: qty 500000

## 2020-05-26 MED ORDER — PROPOFOL 10 MG/ML IV BOLUS
INTRAVENOUS | Status: DC | PRN
  Administered 2020-05-26: 20 mg via INTRAVENOUS
  Administered 2020-05-26: 30 mg via INTRAVENOUS
  Administered 2020-05-26 (×2): 20 mg via INTRAVENOUS
  Administered 2020-05-26: 120 mg via INTRAVENOUS

## 2020-05-26 MED ORDER — FENTANYL CITRATE (PF) 100 MCG/2ML IJ SOLN
INTRAMUSCULAR | Status: DC | PRN
  Administered 2020-05-26 (×4): 50 ug via INTRAVENOUS

## 2020-05-26 MED ORDER — ONDANSETRON HCL 4 MG/2ML IJ SOLN
INTRAMUSCULAR | Status: DC | PRN
  Administered 2020-05-26: 4 mg via INTRAVENOUS

## 2020-05-26 MED ORDER — PANTOPRAZOLE SODIUM 40 MG PO TBEC
40.0000 mg | DELAYED_RELEASE_TABLET | Freq: Every day | ORAL | Status: DC
  Administered 2020-05-26 – 2020-05-29 (×4): 40 mg via ORAL
  Filled 2020-05-26 (×4): qty 1

## 2020-05-26 MED ORDER — ONDANSETRON HCL 4 MG/2ML IJ SOLN
4.0000 mg | Freq: Four times a day (QID) | INTRAMUSCULAR | Status: DC | PRN
  Administered 2020-05-29: 4 mg via INTRAVENOUS
  Filled 2020-05-26: qty 2

## 2020-05-26 MED ORDER — OXYCODONE HCL 5 MG PO TABS
5.0000 mg | ORAL_TABLET | ORAL | Status: DC | PRN
  Administered 2020-05-26 (×2): 5 mg via ORAL
  Filled 2020-05-26 (×2): qty 1

## 2020-05-26 MED ORDER — HYDROMORPHONE HCL 2 MG/ML IJ SOLN
INTRAMUSCULAR | Status: AC
  Filled 2020-05-26: qty 1

## 2020-05-26 MED ORDER — CEFAZOLIN SODIUM-DEXTROSE 2-4 GM/100ML-% IV SOLN
2.0000 g | INTRAVENOUS | Status: AC
  Administered 2020-05-26: 2 g via INTRAVENOUS
  Filled 2020-05-26: qty 100

## 2020-05-26 MED ORDER — PROPOFOL 10 MG/ML IV BOLUS
INTRAVENOUS | Status: AC
  Filled 2020-05-26: qty 20

## 2020-05-26 MED ORDER — METHOCARBAMOL 500 MG IVPB - SIMPLE MED
500.0000 mg | Freq: Four times a day (QID) | INTRAVENOUS | Status: DC | PRN
  Filled 2020-05-26: qty 50

## 2020-05-26 MED ORDER — ONDANSETRON HCL 4 MG/2ML IJ SOLN
INTRAMUSCULAR | Status: AC
  Filled 2020-05-26: qty 2

## 2020-05-26 MED ORDER — METOCLOPRAMIDE HCL 5 MG PO TABS: 5.0000 mg | ORAL_TABLET | Freq: Three times a day (TID) | ORAL | Status: DC | PRN

## 2020-05-26 MED ORDER — BUPIVACAINE-EPINEPHRINE 0.25% -1:200000 IJ SOLN
INTRAMUSCULAR | Status: AC
  Filled 2020-05-26: qty 1

## 2020-05-26 MED ORDER — SODIUM CHLORIDE 0.9% IV SOLUTION: Freq: Once | INTRAVENOUS | Status: DC

## 2020-05-26 MED ORDER — ACETAMINOPHEN 500 MG PO TABS
1000.0000 mg | ORAL_TABLET | Freq: Four times a day (QID) | ORAL | Status: AC
  Administered 2020-05-26 – 2020-05-27 (×4): 1000 mg via ORAL
  Filled 2020-05-26 (×4): qty 2

## 2020-05-26 MED ORDER — CLONIDINE HCL (ANALGESIA) 100 MCG/ML EP SOLN
EPIDURAL | Status: DC | PRN
  Administered 2020-05-26: 50 ug

## 2020-05-26 MED ORDER — SODIUM CHLORIDE 0.9 % IV SOLN
INTRAVENOUS | Status: DC | PRN
  Administered 2020-05-26: 500 mL

## 2020-05-26 MED ORDER — CHLORHEXIDINE GLUCONATE 0.12 % MT SOLN
15.0000 mL | Freq: Once | OROMUCOSAL | Status: AC
  Administered 2020-05-26: 15 mL via OROMUCOSAL

## 2020-05-26 MED ORDER — EPHEDRINE SULFATE-NACL 50-0.9 MG/10ML-% IV SOSY
PREFILLED_SYRINGE | INTRAVENOUS | Status: DC | PRN
  Administered 2020-05-26 (×2): 10 mg via INTRAVENOUS

## 2020-05-26 MED ORDER — ONDANSETRON HCL 4 MG PO TABS: 4.0000 mg | ORAL_TABLET | Freq: Four times a day (QID) | ORAL | Status: DC | PRN

## 2020-05-26 MED ORDER — METHOCARBAMOL 500 MG PO TABS
500.0000 mg | ORAL_TABLET | Freq: Four times a day (QID) | ORAL | Status: DC | PRN
  Administered 2020-05-26 – 2020-05-27 (×5): 500 mg via ORAL
  Filled 2020-05-26 (×5): qty 1

## 2020-05-26 MED ORDER — METOCLOPRAMIDE HCL 5 MG/ML IJ SOLN: 5.0000 mg | Freq: Three times a day (TID) | INTRAMUSCULAR | Status: DC | PRN

## 2020-05-26 MED ORDER — POLYETHYLENE GLYCOL 3350 17 G PO PACK: 17.0000 g | PACK | Freq: Every day | ORAL | Status: DC | PRN

## 2020-05-26 MED ORDER — DEXAMETHASONE SODIUM PHOSPHATE 10 MG/ML IJ SOLN
INTRAMUSCULAR | Status: AC
  Filled 2020-05-26: qty 1

## 2020-05-26 MED ORDER — KCL IN DEXTROSE-NACL 20-5-0.45 MEQ/L-%-% IV SOLN
INTRAVENOUS | Status: AC
  Filled 2020-05-26 (×2): qty 1000

## 2020-05-26 MED ORDER — MAGNESIUM CITRATE PO SOLN: 1.0000 | Freq: Once | ORAL | Status: DC | PRN

## 2020-05-26 MED ORDER — LIDOCAINE 2% (20 MG/ML) 5 ML SYRINGE
INTRAMUSCULAR | Status: AC
  Filled 2020-05-26: qty 5

## 2020-05-26 MED ORDER — PHENOL 1.4 % MT LIQD: 1.0000 | OROMUCOSAL | Status: DC | PRN

## 2020-05-26 MED ORDER — PROPOFOL 500 MG/50ML IV EMUL
INTRAVENOUS | Status: AC
  Filled 2020-05-26: qty 50

## 2020-05-26 MED ORDER — HYDROMORPHONE HCL 1 MG/ML IJ SOLN
INTRAMUSCULAR | Status: DC | PRN
  Administered 2020-05-26 (×2): .5 mg via INTRAVENOUS
  Administered 2020-05-26: 1 mg via INTRAVENOUS

## 2020-05-26 MED ORDER — ARFORMOTEROL TARTRATE 15 MCG/2ML IN NEBU
15.0000 ug | INHALATION_SOLUTION | Freq: Two times a day (BID) | RESPIRATORY_TRACT | Status: DC
  Administered 2020-05-26 – 2020-05-29 (×5): 15 ug via RESPIRATORY_TRACT
  Filled 2020-05-26 (×6): qty 2

## 2020-05-26 MED ORDER — ASPIRIN 81 MG PO CHEW
81.0000 mg | CHEWABLE_TABLET | Freq: Two times a day (BID) | ORAL | Status: DC
  Administered 2020-05-26 – 2020-05-29 (×6): 81 mg via ORAL
  Filled 2020-05-26 (×6): qty 1

## 2020-05-26 MED ORDER — FERROUS SULFATE 325 (65 FE) MG PO TABS
325.0000 mg | ORAL_TABLET | Freq: Two times a day (BID) | ORAL | Status: DC
  Administered 2020-05-26 – 2020-05-29 (×6): 325 mg via ORAL
  Filled 2020-05-26 (×6): qty 1

## 2020-05-26 MED ORDER — BISACODYL 5 MG PO TBEC: 5.0000 mg | DELAYED_RELEASE_TABLET | Freq: Every day | ORAL | Status: DC | PRN

## 2020-05-26 MED ORDER — ACETAMINOPHEN 10 MG/ML IV SOLN
1000.0000 mg | INTRAVENOUS | Status: AC
  Administered 2020-05-26: 1000 mg via INTRAVENOUS
  Filled 2020-05-26: qty 100

## 2020-05-26 MED ORDER — BUPIVACAINE-EPINEPHRINE 0.25% -1:200000 IJ SOLN
INTRAMUSCULAR | Status: DC | PRN
  Administered 2020-05-26: 30 mL

## 2020-05-26 MED ORDER — UMECLIDINIUM BROMIDE 62.5 MCG/INH IN AEPB
1.0000 | INHALATION_SPRAY | Freq: Every day | RESPIRATORY_TRACT | Status: DC
  Administered 2020-05-26 – 2020-05-29 (×4): 1 via RESPIRATORY_TRACT
  Filled 2020-05-26: qty 7

## 2020-05-26 MED ORDER — CLINDAMYCIN PHOSPHATE 900 MG/50ML IV SOLN
INTRAVENOUS | Status: AC
  Filled 2020-05-26: qty 50

## 2020-05-26 MED ORDER — OXYCODONE HCL 5 MG PO TABS
10.0000 mg | ORAL_TABLET | ORAL | Status: DC | PRN
  Administered 2020-05-27 (×3): 15 mg via ORAL
  Administered 2020-05-27: 10 mg via ORAL
  Administered 2020-05-27 – 2020-05-29 (×8): 15 mg via ORAL
  Filled 2020-05-26: qty 3
  Filled 2020-05-26: qty 2
  Filled 2020-05-26 (×10): qty 3

## 2020-05-26 MED ORDER — EPHEDRINE 5 MG/ML INJ
INTRAVENOUS | Status: AC
  Filled 2020-05-26: qty 10

## 2020-05-26 MED ORDER — CLINDAMYCIN PHOSPHATE 900 MG/50ML IV SOLN
900.0000 mg | Freq: Once | INTRAVENOUS | Status: AC
  Administered 2020-05-26: 900 mg via INTRAVENOUS

## 2020-05-26 MED ORDER — PROPOFOL 500 MG/50ML IV EMUL
INTRAVENOUS | Status: DC | PRN
  Administered 2020-05-26: 75 ug/kg/min via INTRAVENOUS

## 2020-05-26 MED ORDER — DIPHENHYDRAMINE HCL 12.5 MG/5ML PO ELIX
12.5000 mg | ORAL_SOLUTION | ORAL | Status: DC | PRN
  Administered 2020-05-27: 25 mg via ORAL
  Filled 2020-05-26: qty 10

## 2020-05-26 MED ORDER — THROMBIN 5000 UNITS EX SOLR
CUTANEOUS | Status: DC | PRN
  Administered 2020-05-26: 5000 [IU] via TOPICAL

## 2020-05-26 MED ORDER — HYDROMORPHONE HCL 1 MG/ML IJ SOLN
0.5000 mg | INTRAMUSCULAR | Status: DC | PRN
  Administered 2020-05-26 – 2020-05-27 (×6): 1 mg via INTRAVENOUS
  Filled 2020-05-26 (×6): qty 1

## 2020-05-26 MED ORDER — LACTATED RINGERS IV SOLN: INTRAVENOUS | Status: DC

## 2020-05-26 MED ORDER — VENLAFAXINE HCL ER 150 MG PO CP24
300.0000 mg | ORAL_CAPSULE | Freq: Every day | ORAL | Status: DC
  Administered 2020-05-27 – 2020-05-29 (×3): 300 mg via ORAL
  Filled 2020-05-26 (×3): qty 2

## 2020-05-26 MED ORDER — CEFAZOLIN SODIUM-DEXTROSE 2-4 GM/100ML-% IV SOLN
2.0000 g | Freq: Four times a day (QID) | INTRAVENOUS | Status: AC
  Administered 2020-05-26 – 2020-05-27 (×3): 2 g via INTRAVENOUS
  Filled 2020-05-26 (×3): qty 100

## 2020-05-26 SURGICAL SUPPLY — 90 items
ATTUNE PS FEM RT SZ 7 CEM KNEE (Femur) ×3 IMPLANT
ATTUNE PSRP INSR SZ7 5 KNEE (Insert) ×2 IMPLANT
ATTUNE PSRP INSR SZ7 5MM KNEE (Insert) ×1 IMPLANT
BAG DECANTER FOR FLEXI CONT (MISCELLANEOUS) ×3 IMPLANT
BAG ZIPLOCK 12X15 (MISCELLANEOUS) IMPLANT
BASE TIBIA ATTUNE KNEE SYS SZ6 (Knees) ×1 IMPLANT
BLADE SAG 18X100X1.27 (BLADE) ×3 IMPLANT
BLADE SAW SGTL 11.0X1.19X90.0M (BLADE) ×3 IMPLANT
BLADE SAW SGTL 13.0X1.19X90.0M (BLADE) ×3 IMPLANT
BLADE SURG SZ10 CARB STEEL (BLADE) ×6 IMPLANT
BNDG COHESIVE 4X5 TAN STRL (GAUZE/BANDAGES/DRESSINGS) ×3 IMPLANT
BNDG ELASTIC 4X5.8 VLCR STR LF (GAUZE/BANDAGES/DRESSINGS) ×3 IMPLANT
BNDG ELASTIC 6X5.8 VLCR STR LF (GAUZE/BANDAGES/DRESSINGS) ×3 IMPLANT
CEMENT HV SMART SET (Cement) ×6 IMPLANT
CHLORAPREP W/TINT 26 (MISCELLANEOUS) ×6 IMPLANT
CLOSURE WOUND 1/2 X4 (GAUZE/BANDAGES/DRESSINGS)
COVER SURGICAL LIGHT HANDLE (MISCELLANEOUS) ×3 IMPLANT
COVER WAND RF STERILE (DRAPES) IMPLANT
CUFF TOURN SGL QUICK 34 (TOURNIQUET CUFF) ×2
CUFF TRNQT CYL 34X4.125X (TOURNIQUET CUFF) ×1 IMPLANT
DECANTER SPIKE VIAL GLASS SM (MISCELLANEOUS) ×3 IMPLANT
DRAPE INCISE IOBAN 66X45 STRL (DRAPES) ×3 IMPLANT
DRAPE ORTHO SPLIT 77X108 STRL (DRAPES) ×4
DRAPE SHEET LG 3/4 BI-LAMINATE (DRAPES) ×6 IMPLANT
DRAPE SURG ORHT 6 SPLT 77X108 (DRAPES) ×2 IMPLANT
DRAPE U-SHAPE 47X51 STRL (DRAPES) ×3 IMPLANT
DRSG AQUACEL AG ADV 3.5X10 (GAUZE/BANDAGES/DRESSINGS) ×3 IMPLANT
DRSG EMULSION OIL 3X16 NADH (GAUZE/BANDAGES/DRESSINGS) ×6 IMPLANT
DRSG PAD ABDOMINAL 8X10 ST (GAUZE/BANDAGES/DRESSINGS) ×3 IMPLANT
DRSG TEGADERM 4X4.75 (GAUZE/BANDAGES/DRESSINGS) ×3 IMPLANT
ELECT BLADE TIP CTD 4 INCH (ELECTRODE) ×3 IMPLANT
ELECT REM PT RETURN 15FT ADLT (MISCELLANEOUS) ×3 IMPLANT
EVACUATOR 1/8 PVC DRAIN (DRAIN) ×3 IMPLANT
GAUZE SPONGE 2X2 8PLY STRL LF (GAUZE/BANDAGES/DRESSINGS) ×1 IMPLANT
GAUZE SPONGE 4X4 12PLY STRL (GAUZE/BANDAGES/DRESSINGS) ×3 IMPLANT
GAUZE XEROFORM 1X8 LF (GAUZE/BANDAGES/DRESSINGS) ×3 IMPLANT
GLOVE BIOGEL PI IND STRL 7.5 (GLOVE) ×1 IMPLANT
GLOVE BIOGEL PI IND STRL 8 (GLOVE) ×1 IMPLANT
GLOVE BIOGEL PI INDICATOR 7.5 (GLOVE) ×2
GLOVE BIOGEL PI INDICATOR 8 (GLOVE) ×2
GLOVE SURG SS PI 7.5 STRL IVOR (GLOVE) ×6 IMPLANT
GLOVE SURG SS PI 8.0 STRL IVOR (GLOVE) ×6 IMPLANT
GOWN STRL REUS W/TWL XL LVL3 (GOWN DISPOSABLE) ×6 IMPLANT
HANDPIECE INTERPULSE COAX TIP (DISPOSABLE) ×2
HEMOSTAT SPONGE AVITENE ULTRA (HEMOSTASIS) ×3 IMPLANT
HIBICLENS CHG 4% 4OZ BTL (MISCELLANEOUS) ×3 IMPLANT
HOLDER FOLEY CATH W/STRAP (MISCELLANEOUS) ×3 IMPLANT
IMMOBILIZER KNEE 20 (SOFTGOODS) ×3
IMMOBILIZER KNEE 20 THIGH 36 (SOFTGOODS) ×1 IMPLANT
KIT TURNOVER KIT A (KITS) IMPLANT
MANIFOLD NEPTUNE II (INSTRUMENTS) ×3 IMPLANT
NDL SAFETY ECLIPSE 18X1.5 (NEEDLE) IMPLANT
NEEDLE HYPO 18GX1.5 SHARP (NEEDLE)
NS IRRIG 1000ML POUR BTL (IV SOLUTION) IMPLANT
PACK TOTAL KNEE CUSTOM (KITS) ×3 IMPLANT
PADDING CAST ABS 6INX4YD NS (CAST SUPPLIES) ×2
PADDING CAST ABS COTTON 6X4 NS (CAST SUPPLIES) ×1 IMPLANT
PATELLA MEDIAL ATTUN 35MM KNEE (Knees) ×3 IMPLANT
PENCIL SMOKE EVACUATOR (MISCELLANEOUS) IMPLANT
PIN FIX SIGMA LCS THRD HI (PIN) ×3 IMPLANT
PIN STEINMAN FIXATION KNEE (PIN) ×3 IMPLANT
PROTECTOR NERVE ULNAR (MISCELLANEOUS) ×3 IMPLANT
SEALER BIPOLAR AQUA 6.0 (INSTRUMENTS) ×6 IMPLANT
SET HNDPC FAN SPRY TIP SCT (DISPOSABLE) ×1 IMPLANT
SPONGE GAUZE 2X2 STER 10/PKG (GAUZE/BANDAGES/DRESSINGS) ×2
SPONGE LAP 18X18 RF (DISPOSABLE) ×9 IMPLANT
SPONGE SURGIFOAM ABS GEL 100 (HEMOSTASIS) ×3 IMPLANT
STAPLER VISISTAT (STAPLE) ×3 IMPLANT
STRIP CLOSURE SKIN 1/2X4 (GAUZE/BANDAGES/DRESSINGS) IMPLANT
SUCTION FRAZIER HANDLE 10FR (MISCELLANEOUS) ×2
SUCTION TUBE FRAZIER 10FR DISP (MISCELLANEOUS) ×1 IMPLANT
SUT BONE WAX W31G (SUTURE) ×3 IMPLANT
SUT MNCRL AB 4-0 PS2 18 (SUTURE) IMPLANT
SUT STRATAFIX 0 PDS 27 VIOLET (SUTURE) ×6
SUT VIC AB 1 CT1 27 (SUTURE) ×8
SUT VIC AB 1 CT1 27XBRD ANTBC (SUTURE) ×4 IMPLANT
SUT VIC AB 1 CTX 36 (SUTURE)
SUT VIC AB 1 CTX36XBRD ANBCTR (SUTURE) IMPLANT
SUT VIC AB 2-0 CT1 27 (SUTURE) ×6
SUT VIC AB 2-0 CT1 TAPERPNT 27 (SUTURE) ×3 IMPLANT
SUTURE STRATFX 0 PDS 27 VIOLET (SUTURE) ×2 IMPLANT
SYR 3ML LL SCALE MARK (SYRINGE) IMPLANT
SYR 50ML LL SCALE MARK (SYRINGE) IMPLANT
TIBIA ATTUNE KNEE SYS BASE SZ6 (Knees) ×3 IMPLANT
TOWER CARTRIDGE SMART MIX (DISPOSABLE) ×3 IMPLANT
TRAY FOLEY MTR SLVR 14FR STAT (SET/KITS/TRAYS/PACK) ×3 IMPLANT
WATER STERILE IRR 1000ML POUR (IV SOLUTION) ×3 IMPLANT
WIPE CHG CHLORHEXIDINE 2% (PERSONAL CARE ITEMS) ×3 IMPLANT
WRAP KNEE MAXI GEL POST OP (GAUZE/BANDAGES/DRESSINGS) ×3 IMPLANT
YANKAUER SUCT BULB TIP 10FT TU (MISCELLANEOUS) ×3 IMPLANT

## 2020-05-26 NOTE — Anesthesia Postprocedure Evaluation (Signed)
Anesthesia Post Note  Patient: Ellen Bell  Procedure(s) Performed: TOTAL KNEE ARTHROPLASTY (Right Knee)     Patient location during evaluation: PACU Anesthesia Type: Combined General/Spinal Level of consciousness: awake and alert, awake and oriented Pain management: pain level controlled Vital Signs Assessment: post-procedure vital signs reviewed and stable Respiratory status: spontaneous breathing, nonlabored ventilation, respiratory function stable and patient connected to nasal cannula oxygen Cardiovascular status: blood pressure returned to baseline and stable Postop Assessment: no apparent nausea or vomiting, patient able to bend at knees, spinal receding, no headache and no backache Anesthetic complications: no   No complications documented.  Last Vitals:  Vitals:   05/26/20 1403 05/26/20 1607  BP: 108/63 (!) 118/59  Pulse: 70 67  Resp: 14 12  Temp:  (!) 36.4 C  SpO2: 100% 97%    Last Pain:  Vitals:   05/26/20 1315  TempSrc:   PainSc: Asleep                 Cecile Hearing

## 2020-05-26 NOTE — Interval H&P Note (Signed)
History and Physical Interval Note:  05/26/2020 8:27 AM  Ellen Bell  has presented today for surgery, with the diagnosis of Right knee degenerative joint disease.  The various methods of treatment have been discussed with the patient and family. After consideration of risks, benefits and other options for treatment, the patient has consented to  Procedure(s) with comments: TOTAL KNEE ARTHROPLASTY (Right) - 2.5 hrs as a surgical intervention.  The patient's history has been reviewed, patient examined, no change in status, stable for surgery.  I have reviewed the patient's chart and labs.  Questions were answered to the patient's satisfaction.     Javier Docker  Discussed no tobacco  as increase in DVT and infection risk. Patient indicated she would comply.

## 2020-05-26 NOTE — Anesthesia Procedure Notes (Signed)
Date/Time: 05/26/2020 8:34 AM Performed by: Florene Route, CRNA Oxygen Delivery Method: Simple face mask

## 2020-05-26 NOTE — Anesthesia Procedure Notes (Signed)
Procedure Name: LMA Insertion Date/Time: 05/26/2020 10:22 AM Performed by: Cecile Hearing, MD Induction Type: IV induction LMA: LMA inserted LMA Size: 4.0 Number of attempts: 1 Placement Confirmation: positive ETCO2 and breath sounds checked- equal and bilateral Tube secured with: Tape Dental Injury: Teeth and Oropharynx as per pre-operative assessment

## 2020-05-26 NOTE — Evaluation (Signed)
Physical Therapy Evaluation Patient Details Name: Ellen Bell MRN: [id number] DOB: 1960/11/12 Today's Date: 05/26/2020   History of Present Illness  Patient is 59 y.o. female s/p Rt TKA on 05/26/20 with PMH significant for OA, depression, anxiety.  Clinical Impression  Ellen Bell is a 59 y.o. female POD 0 s/p Rt TKA. Patient reports independence with [facility] for mobility at baseline. Patient is now limited by functional impairments (see PT problem list below) and requires min assist for transfers and gait with RW. Patient was able to ambulate ~16 feet with RW and min assist and distance was limited by pain today. Patient instructed in exercise to facilitate circulation. Patient will benefit from continued skilled PT interventions to address impairments and progress towards PLOF. Acute PT will follow to progress mobility and stair training in preparation for safe discharge home.     Follow Up Recommendations Follow surgeon's recommendation for DC plan and follow-up therapies    Equipment Recommendations  Rolling walker with 5" wheels    Recommendations for Other Services       Precautions / Restrictions Precautions Precautions: Fall Restrictions Weight Bearing Restrictions: No Other Position/Activity Restrictions: WBAT      Mobility  Bed Mobility Overal bed mobility: Needs Assistance Bed Mobility: Supine to Sit     Supine to sit: Min assist;HOB elevated     General bed mobility comments: VC's for sequencing use of bed rail, assist to bring Rt LE off EOB and to raise trunk upright.   Transfers Overall transfer level: Needs assistance Equipment used: Rolling walker (2 wheeled) Transfers: Sit to/from Stand Sit to Stand: Min assist;From elevated surface         General transfer comment: VC's for safe technique with RW, pt required elevated surface. pt usign momentum and cues required for safety with rising.   Ambulation/Gait Ambulation/Gait assistance: Min assist Gait  Distance (Feet): 16 Feet Assistive device: Rolling walker (2 wheeled) Gait Pattern/deviations: Step-to pattern;Decreased step length - left;Decreased stance time - right;Decreased stride length;Decreased weight shift to right Gait velocity: decr   General Gait Details: VC's for safe step pattern and proximity to RW, assist to manage walker position intermittently. pt limited by pain for distance.  Stairs            Wheelchair Mobility    Modified Rankin (Stroke Patients Only)       Balance Overall balance assessment: Needs assistance Sitting-balance support: Feet supported;Bilateral upper extremity supported Sitting balance-Leahy Scale: Good     Standing balance support: During functional activity;Bilateral upper extremity supported Standing balance-Leahy Scale: Poor              Pertinent Vitals/Pain Pain Assessment: 0-10 Pain Score: 9  Pain Location: Rt knee Pain Descriptors / Indicators: Aching;Discomfort Pain Intervention(s): Limited activity within patient's tolerance;Monitored during session;Patient requesting pain meds-RN notified;Ice applied;Repositioned    Home Living Family/patient expects to be discharged to:: Private residence Living Arrangements: Spouse/significant other;Children Available Help at Discharge: Family Type of Home: House Home Access: Stairs to enter Entrance Stairs-Rails: Can reach both Entrance Stairs-Number of Steps: 2 Home Layout: Two level;Full bath on main level;Able to live on main level with bedroom/bathroom Home Equipment: Gilmer Mor - single point;Toilet riser      Prior Function Level of Independence: Independent with assistive device(s)               Hand Dominance   Dominant Hand: Right    Extremity/Trunk Assessment   Upper Extremity Assessment Upper Extremity Assessment: Overall WFL for  tasks assessed    Lower Extremity Assessment Lower Extremity Assessment: RLE deficits/detail RLE Deficits / Details: good  quad activation, slight extensor lag with SLR, immobilizer used for mobility RLE: Unable to fully assess due to pain RLE Sensation: WNL RLE Coordination: WNL    Cervical / Trunk Assessment Cervical / Trunk Assessment: Normal  Communication   Communication: No difficulties  Cognition Arousal/Alertness: Awake/alert Behavior During Therapy: WFL for tasks assessed/performed Overall Cognitive Status: Within Functional Limits for tasks assessed           General Comments      Exercises Total Joint Exercises Ankle Circles/Pumps: AROM;Both;20 reps;Seated   Assessment/Plan    PT Assessment Patient needs continued PT services  PT Problem List Decreased strength;Decreased range of motion;Decreased activity tolerance;Decreased balance;Decreased mobility;Decreased knowledge of use of DME;Pain;Decreased knowledge of precautions       PT Treatment Interventions DME instruction;Gait training;Stair training;Functional mobility training;Therapeutic activities;Therapeutic exercise;Balance training;Patient/family education    PT Goals (Current goals can be found in the Care Plan section)  Acute Rehab PT Goals Patient Stated Goal: be able to get back to her garden PT Goal Formulation: With patient Time For Goal Achievement: 06/02/20 Potential to Achieve Goals: Good    Frequency 7X/week   Barriers to discharge       AM-PAC PT "6 Clicks" Mobility  Outcome Measure Help needed turning from your back to your side while in a flat bed without using bedrails?: A Little Help needed moving from lying on your back to sitting on the side of a flat bed without using bedrails?: A Little Help needed moving to and from a bed to a chair (including a wheelchair)?: A Little Help needed standing up from a chair using your arms (e.g., wheelchair or bedside chair)?: A Little Help needed to walk in hospital room?: A Little Help needed climbing 3-5 steps with a railing? : A Little 6 Click Score: 18    End  of Session Equipment Utilized During Treatment: Gait belt Activity Tolerance: Patient tolerated treatment well;Patient limited by pain Patient left: in chair;with call bell/phone within reach;with chair alarm set;with family/visitor present;with nursing/sitter in room Nurse Communication: Mobility status;Patient requests pain meds PT Visit Diagnosis: Muscle weakness (generalized) (M62.81);Difficulty in walking, not elsewhere classified (R26.2);Pain Pain - Right/Left: Right Pain - part of body: Knee    Time: 5427-0623 PT Time Calculation (min) (ACUTE ONLY): 29 min   Charges:   PT Evaluation $PT Eval Low Complexity: 1 Low PT Treatments $Gait Training: 8-22 mins        Wynn Maudlin, DPT Acute Rehabilitation Services  Office [phone] Pager [phone]  05/26/2020 4:27 PM

## 2020-05-26 NOTE — Discharge Instructions (Signed)

## 2020-05-26 NOTE — Anesthesia Procedure Notes (Signed)
Anesthesia Regional Block: Adductor canal block   Pre-Anesthetic Checklist: ,, timeout performed, Correct Patient, Correct Site, Correct Laterality, Correct Procedure, Correct Position, site marked, Risks and benefits discussed,  Surgical consent,  Pre-op evaluation,  At surgeon's request and post-op pain management  Laterality: Right  Prep: chloraprep       Needles:  Injection technique: Single-shot  Needle Type: Echogenic Needle     Needle Length: 9cm  Needle Gauge: 21     Additional Needles:   Procedures:,,,, ultrasound used (permanent image in chart),,,,  Narrative:  Start time: 05/26/2020 7:32 AM End time: 05/26/2020 7:42 AM Injection made incrementally with aspirations every 5 mL.  Performed by: Personally  Anesthesiologist: Cecile Hearing, MD  Additional Notes: No pain on injection. No increased resistance to injection. Injection made in 5cc increments.  Good needle visualization.  Patient tolerated procedure well.

## 2020-05-26 NOTE — Transfer of Care (Signed)
Immediate Anesthesia Transfer of Care Note  Patient: Ellen Bell  Procedure(s) Performed: TOTAL KNEE ARTHROPLASTY (Right Knee)  Patient Location: PACU  Anesthesia Type:General and Regional  Level of Consciousness: sedated  Airway & Oxygen Therapy: Patient Spontanous Breathing and Patient connected to face mask oxygen  Post-op Assessment: Report given to RN and Post -op Vital signs reviewed and stable  Post vital signs: Reviewed and stable  Last Vitals:  Vitals Value Taken Time  BP    Temp    Pulse 90 05/26/20 1141  Resp 9 05/26/20 1141  SpO2 99 % 05/26/20 1141  Vitals shown include unvalidated device data.  Last Pain:  Vitals:   05/26/20 0725  TempSrc: Oral  PainSc:          Complications: No complications documented.

## 2020-05-26 NOTE — Brief Op Note (Signed)
05/26/2020  11:10 AM  PATIENT:  Ellen Bell  59 y.o. female  PRE-OPERATIVE DIAGNOSIS:  Right knee degenerative joint disease  POST-OPERATIVE DIAGNOSIS:  Right knee degenerative joint disease  PROCEDURE:  Procedure(s) with comments: TOTAL KNEE ARTHROPLASTY (Right) - 2.5 hrs  SURGEON:  Surgeon(s) and Role:    Jene Every, MD - Primary  PHYSICIAN ASSISTANT:   ASSISTANTS: Bissell   ANESTHESIA:   spinal  EBL:  550 mL   BLOOD ADMINISTERED:none  DRAINS: (1) Hemovact drain(s) in the open with  Suction Clamped   LOCAL MEDICATIONS USED:  MARCAINE     SPECIMEN:  No Specimen  DISPOSITION OF SPECIMEN:  N/A  COUNTS:  YES  TOURNIQUET:   Total Tourniquet Time Documented: Calf (Right) - 70 minutes Total: Calf (Right) - 70 minutes   DICTATION: .Other Dictation: Dictation Number     [id number]   PLAN OF CARE: Admit to inpatient   PATIENT DISPOSITION:  PACU - hemodynamically stable.   Delay start of Pharmacological VTE agent (>24hrs) due to surgical blood loss or risk of bleeding: no

## 2020-05-26 NOTE — Anesthesia Procedure Notes (Signed)
Spinal  Patient location during procedure: OR Start time: 05/26/2020 8:34 AM End time: 05/26/2020 8:37 AM Staffing Performed: anesthesiologist  Anesthesiologist: Cecile Hearing, MD Preanesthetic Checklist Completed: patient identified, IV checked, risks and benefits discussed, surgical consent, monitors and equipment checked, pre-op evaluation and timeout performed Spinal Block Patient position: sitting Prep: DuraPrep and site prepped and draped Patient monitoring: continuous pulse ox and blood pressure Approach: midline Location: L3-4 Injection technique: single-shot Needle Needle type: Pencan  Needle gauge: 24 G Assessment Sensory level: T8 Additional Notes Functioning IV was confirmed and monitors were applied. Sterile prep and drape, including hand hygiene, mask and sterile gloves were used. The patient was positioned and the spine was prepped. The skin was anesthetized with lidocaine.  Free flow of clear CSF was obtained prior to injecting local anesthetic into the CSF.  The spinal needle aspirated freely following injection.  The needle was carefully withdrawn.  The patient tolerated the procedure well. Consent was obtained prior to procedure with all questions answered and concerns addressed. Risks including but not limited to bleeding, infection, nerve damage, paralysis, failed block, inadequate analgesia, allergic reaction, high spinal, itching and headache were discussed and the patient wished to proceed.   Arrie Aran, MD

## 2020-05-26 NOTE — Op Note (Signed)
NAME: Ellen Bell, Ellen Bell MEDICAL RECORD [id number] ACCOUNT [id number] DATE OF BIRTH:September 07, 1961 FACILITY: [facility] LOCATION: [id number] PHYSICIAN:Elaiza Shoberg Connye Burkitt, MD  OPERATIVE REPORT  DATE OF PROCEDURE:  05/26/2020  PREOPERATIVE DIAGNOSIS:  End-stage osteoarthritis of the right knee.  POSTOPERATIVE DIAGNOSIS:  End-stage osteoarthritis of the right knee.  PROCEDURE PERFORMED:  Right total knee arthroplasty utilizing DePuy Attune rotating platform, 7 femur, 6 tibia, 5 insert, 35 patella.  ANESTHESIA:  Spinal.  SURGEON:  Jene Every, MD  ASSISTANT:  Andrez Grime, PA  HISTORY:  A 59 year old with end-stage osteoarthritis, medial compartment of the right knee, bone-on-bone, refractory to conservative treatment.  Negative affect to her activities of daily living.  She was indicated for replacement of the degenerative  joint.  Risks and benefits discussed including bleeding, infection, damage to neurovascular structures, no change in symptoms, worsening symptoms, DVT, PE, anesthetic complications, etc.  TECHNIQUE:  With the patient in supine position, after induction of adequate spinal anesthesia and 2 g of Kefzol, 600 of clindamycin, the right lower extremity was prepped and draped in the usual sterile fashion.  Thigh tourniquet inflated to 250 mmHg.   A midline incision was then made over the patella.  Full thickness flaps developed.  Median parapatellar arthrotomy was performed.  Patella was everted.  Knee flexed.  We elevated soft tissues medially, preserving the MCL.  Tricompartmental severe  osteoarthritis was noted, bone-on-bone.  A Leksell rongeur was utilized to remove osteophytes, remnants of medial and lateral menisci.  A notch was placed in the femoral canal and a Leksell rongeur was utilized to initiate a starting place for the  intramedullary reamer above the notch.  I used a femoral reamer and engaged the intramedullary portion of the femur.  It was irrigated.  A T-handle reamer  was then placed.  It was felt in the canal in all directions.  Then a 5-degree right was utilized  with 9 off the distal femur.  This was then pinned and the oscillating saw was utilized to perform the distal femoral cut.  I then sized the femur off the distal femur off the anterior cortex and sized between a 6 and a 7.  We chose a 7 as a 6 seemed to  be too large of a cut off the medial condyle.  This was then pinned.  Distal femoral cutting block was then applied. We performed anterior, posterior and chamfer cuts.  Soft tissue protected posteriorly at all times.  We then subluxed the tibia.   Osteophytes were removed with a rongeur.  The low side was medially with eburnated bone.  Two off the medial side was then utilized, an external alignment guide, bisecting the tibiotalar joint  to the shaft, 3 degrees slope and a slight increase in slope  to 4 degrees.  This was then pinned and we performed our proximal tibial cut.  With a sizer, flexion and extension gaps were found to be equivalent utilizing a 5 mm insert.  We then flexed the knee, subluxed the tibia to protect the soft tissues, sized  the tibia to a 6 just to the medial aspect of the tibial tubercle.  This was pinned.  We harvested bone from the canal.  We then used our central reaming guide and then our punch guide for the tibia.  We then turned our attention back to the femur.  We  bisected the femur with a box cut jig.  This was then performed and we placed our trial femur onto that, a trial tibia insert,  which was 5 mm.  We inserted that, we had full extension, full flexion, good stability with varus valgus stressing at 0 and 30  degrees, negative anterior drawer.  We then everted the patella, prepared the patella.  It was measured to a 25 plane to a 16 utilizing the external patellar jig.  This was then measured residual at 16.  We chose a 35 patella and medializing our patella  with our peg holes when drilled, trial patella was reduced  the  patella and we had excellent patellofemoral tracking.  Next, we removed all instrumentation.  We checked posteriorly and we actually had some residuals of the medial meniscus.  The lateral  meniscus was removed with electrocautery.  We used an Aquamantys to cauterize the geniculates and the posterior remaining capsule and tissue.  It was copiously irrigated with antibiotic irrigation.  We then flexed the knee, dried all surfaces thoroughly.   Mixed the cement on the back table in appropriate fashion, injected into the tibial canal, digitally pressurizing it.  We placed cement on the tibial tray, impacted it, redundant cement removed.  We cemented the femoral component and cement on the  femoral component as well.  This was impacted.  Redundant cement removed.  Prior to this, we had placed the bone plug into the femoral canal.  I then took a 5 trial and reduced it, held in axial load throughout the curing of the cement and cemented and  clamped the patella as well.  Copiously irrigated with antibiotic irrigation.  Redundant cement removed.  At approximately 50 minutes, the tourniquet was deflated and at 70 minutes, we used the Aquamantys to achieve electrocautery.  There was seen to be  some bleeding associated with the release of the tourniquet posteriorly.  This was then temporarily packed with a sponge.  We waited a few minutes and then returned.  We saw that there was still some bleeding.  We had flexed the knee prior to that.   Redundant cement removed with an osteotome.  I removed the insert after using the Aquamantys down the lateral gutters, using a Crego and the Aquamantys, both medially and laterally.  We then removed the insert and there was some bleeding from the  posterolateral aspect of the capsule near the lateral geniculates.  The patient was starting to move.  The spinal was incomplete.  This made visualization difficult.  Impacted again with a sponge using thrombin-soaked Gelfoam and waited a  period of time.   We then re-distracted in the extended position, was able to see an active geniculate bleeder.  This was cauterized with electrocautery and the Aquamantys, as was the posterior capsule, both medially and laterally.  Following this, this achieved  hemostasis.  Copiously irrigated the wound.  Put some thrombin-soaked Gelfoam posteriorly.  We flexed the knee, irrigated the surfaces, placed a 5 permanent insert and reduced the knee.  Towel clamps on the quadriceps tendon.  We had excellent  patellofemoral tracking, full extension, full flexion, good stability with varus valgus stress, 0 to 30 degrees, negative anterior drawer.  Bone wax placed on the knee free surfaces.  There was some mild oozing and we felt appropriate to have Hemovac  placed through a lateral stab wound in the skin laterally.  I felt hemostasis was satisfactorily achieved.  I reapproximated the patellar tendon with #1 Vicryl interrupted figure-of-eight sutures in the arthrotomy, oversewn with a running Stratafix.   Prior to that, I placed Marcaine with epinephrine into the joint to  aid in hemostasis.  Following the closure, we had good flexion, good extension, good stability with varus valgus stressing at 0 and 30 degrees, negative anterior drawer.  Copiously  irrigated the wound.  Subcutaneous with 2-0 and skin with staples.  Wound was dressed sterilely.  She was placed in an immobilizer.  Prior to that, there was additional effort to obtain relaxation and anesthesia towards the lateral portion of the closure  that was finally satisfactory.  Good vascularization of lower extremity appreciated.  The patient was then transported to the recovery room in satisfactory condition.  The patient tolerated the procedure well.  No complications.  Assistant Andrez Grime, [place].  Blood loss 500 mL.  VN/NUANCE  D:05/26/2020 T:05/26/2020 JOB:[id number]

## 2020-05-27 LAB — CBC
HCT: 38.5 % (ref 36.0–46.0)
Hemoglobin: 12 g/dL (ref 12.0–15.0)
MCH: 29.4 pg (ref 26.0–34.0)
MCHC: 31.2 g/dL (ref 30.0–36.0)
MCV: 94.4 fL (ref 80.0–100.0)
Platelets: 173 10*3/uL (ref 150–400)
RBC: 4.08 MIL/uL (ref 3.87–5.11)
RDW: 14.1 % (ref 11.5–15.5)
WBC: 13.8 10*3/uL — ABNORMAL HIGH (ref 4.0–10.5)
nRBC: 0 % (ref 0.0–0.2)

## 2020-05-27 LAB — BASIC METABOLIC PANEL
Anion gap: 7 (ref 5–15)
BUN: 10 mg/dL (ref 6–20)
CO2: 28 mmol/L (ref 22–32)
Calcium: 8.7 mg/dL — ABNORMAL LOW (ref 8.9–10.3)
Chloride: 107 mmol/L (ref 98–111)
Creatinine, Ser: 0.46 mg/dL (ref 0.44–1.00)
GFR calc Af Amer: >60 mL/min (ref >60)
GFR calc non Af Amer: >60 mL/min (ref >60)
Glucose, Bld: 159 mg/dL — ABNORMAL HIGH (ref 70–99)
Potassium: 3.9 mmol/L (ref 3.5–5.1)
Sodium: 142 mmol/L (ref 135–145)

## 2020-05-27 MED ORDER — METHOCARBAMOL 500 MG PO TABS: 500.0000 mg | ORAL_TABLET | Freq: Four times a day (QID) | ORAL | 1 refills | Status: AC | PRN

## 2020-05-27 MED ORDER — DOCUSATE SODIUM 100 MG PO CAPS: 100.0000 mg | ORAL_CAPSULE | Freq: Two times a day (BID) | ORAL | 0 refills | Status: AC | PRN

## 2020-05-27 MED ORDER — POLYETHYLENE GLYCOL 3350 17 G PO PACK: 17.0000 g | PACK | Freq: Every day | ORAL | 0 refills | Status: AC | PRN

## 2020-05-27 MED ORDER — OXYCODONE HCL 5 MG PO TABS: 5.0000 mg | ORAL_TABLET | ORAL | 0 refills | Status: AC | PRN

## 2020-05-27 MED ORDER — ASPIRIN 81 MG PO CHEW: 81.0000 mg | CHEWABLE_TABLET | Freq: Two times a day (BID) | ORAL | 0 refills | Status: AC

## 2020-05-27 NOTE — Discharge Summary (Signed)
Physician Discharge Summary   Patient ID: Ellen Bell MRN: [id number] DOB/AGE: 58-Apr-1962 58 y.o.  Admit date: 05/26/2020 Discharge date: 05/29/20  Primary Diagnosis: primary Right knee osteoarthritis  Admission Diagnoses:  Past Medical History:  Diagnosis Date  . Anxiety   . Arthritis   . Depression   . Pneumonia    hx of walking pneumonia - 2019    Discharge Diagnoses:   Active Problems:   Right knee DJD  Estimated body mass index is 37.93 kg/m as calculated from the following:   Height as of this encounter: 5\' 5"  (1.651 m).   Weight as of this encounter: 103.4 kg.  Procedure:  Procedure(s) (LRB): TOTAL KNEE ARTHROPLASTY (Right)   Consults: None  HPI: see H&P Laboratory Data: Admission on 05/26/2020  Component Date Value Ref Range Status  . ABO/RH(D) 05/26/2020 O NEG   Final  . Antibody Screen 05/26/2020 NEG   Final  . Sample Expiration 05/26/2020    Final                   Value:05/29/2020,2359 Performed at [facility], [address] [place], [place]   . Sodium 05/26/2020 143  135 - 145 mmol/L Final  . Potassium 05/26/2020 4.0  3.5 - 5.1 mmol/L Final  . Chloride 05/26/2020 103  98 - 111 mmol/L Final  . BUN 05/26/2020 11  6 - 20 mg/dL Final  . Creatinine, Ser 05/26/2020 0.50  0.44 - 1.00 mg/dL Final  . Glucose, Bld 05/28/2020 120* 70 - 99 mg/dL Final   Glucose reference range applies only to samples taken after fasting for at least 8 hours.  . Calcium, Ion 05/26/2020 1.36  1.15 - 1.40 mmol/L Final  . TCO2 05/26/2020 30  22 - 32 mmol/L Final  . Hemoglobin 05/26/2020 12.2  12.0 - 15.0 g/dL Final  . HCT 05/28/2020 36.0  36 - 46 % Final  . ABO/RH(D) 05/26/2020    Final                   Value:O NEG Performed at [facility], [address], [place], [place]   . WBC 05/27/2020 13.8* 4.0 - 10.5 K/uL Final  . RBC 05/27/2020 4.08  3.87 - 5.11 MIL/uL Final  . Hemoglobin 05/27/2020 12.0  12.0 - 15.0 g/dL  Final  . HCT 05/29/2020 38.5  36 - 46 % Final  . MCV 05/27/2020 94.4  80.0 - 100.0 fL Final  . MCH 05/27/2020 29.4  26.0 - 34.0 pg Final  . MCHC 05/27/2020 31.2  30.0 - 36.0 g/dL Final  . RDW 05/29/2020 14.1  11.5 - 15.5 % Final  . Platelets 05/27/2020 173  150 - 400 K/uL Final  . nRBC 05/27/2020 0.0  0.0 - 0.2 % Final   Performed at [facility], [address] [place], [place]  . Sodium 05/27/2020 142  135 - 145 mmol/L Final  . Potassium 05/27/2020 3.9  3.5 - 5.1 mmol/L Final  . Chloride 05/27/2020 107  98 - 111 mmol/L Final  . CO2 05/27/2020 28  22 - 32 mmol/L Final  . Glucose, Bld 05/27/2020 159* 70 - 99 mg/dL Final   Glucose reference range applies only to samples taken after fasting for at least 8 hours.  . BUN 05/27/2020 10  6 - 20 mg/dL Final  . Creatinine, Ser 05/27/2020 0.46  0.44 - 1.00 mg/dL Final  . Calcium 05/29/2020 8.7* 8.9 - 10.3 mg/dL  Final  . GFR calc non Af Amer 05/27/2020 >60  >60 mL/min Final  . GFR calc Af Amer 05/27/2020 >60  >60 mL/min Final  . Anion gap 05/27/2020 7  5 - 15 Final   Performed at [facility], [address], [place], [place] [address]  Hospital Outpatient Visit on 05/22/2020  Component Date Value Ref Range Status  . SARS Coronavirus 2 05/22/2020 NEGATIVE  NEGATIVE Final   Comment: (NOTE) SARS-CoV-2 target nucleic acids are NOT DETECTED.  The SARS-CoV-2 RNA is generally detectable in upper and lower respiratory specimens during the acute phase of infection. Negative results do not preclude SARS-CoV-2 infection, do not rule out co-infections with other pathogens, and should not be used as the sole basis for treatment or other patient management decisions. Negative results must be combined with clinical observations, patient history, and epidemiological information. The expected result is Negative.  Fact Sheet for Patients: HairSlick.no  Fact Sheet for  Healthcare Providers: quierodirigir.com  This test is not yet approved or cleared by the [place] FDA and  has been authorized for detection and/or diagnosis of SARS-CoV-2 by FDA under an Emergency Use Authorization (EUA). This EUA will remain  in effect (meaning this test can be used) for the duration of the COVID-19 declaration under Se                          ction 564(b)(1) of the Act, 21 U.S.C. section 360bbb-3(b)(1), unless the authorization is terminated or revoked sooner.  Performed at [facility], [address], [place], [place] [address]   Hospital Outpatient Visit on 05/19/2020  Component Date Value Ref Range Status  . aPTT 05/19/2020 33  24 - 36 seconds Final   Performed at [facility], [address] [place], [place] [address]  . Sodium 05/19/2020 144  135 - 145 mmol/L Final  . Potassium 05/19/2020 4.0  3.5 - 5.1 mmol/L Final  . Chloride 05/19/2020 105  98 - 111 mmol/L Final  . CO2 05/19/2020 29  22 - 32 mmol/L Final  . Glucose, Bld 05/19/2020 88  70 - 99 mg/dL Final   Glucose reference range applies only to samples taken after fasting for at least 8 hours.  . BUN 05/19/2020 14  6 - 20 mg/dL Final  . Creatinine, Ser 05/19/2020 0.53  0.44 - 1.00 mg/dL Final  . Calcium 85/46/2703 9.2  8.9 - 10.3 mg/dL Final  . GFR calc non Af Amer 05/19/2020 >60  >60 mL/min Final  . GFR calc Af Amer 05/19/2020 >60  >60 mL/min Final  . Anion gap 05/19/2020 10  5 - 15 Final   Performed at [facility], [address] [place], [place] [address]  . WBC 05/19/2020 7.3  4.0 - 10.5 K/uL Final  . RBC 05/19/2020 5.09  3.87 - 5.11 MIL/uL Final  . Hemoglobin 05/19/2020 15.2* 12.0 - 15.0 g/dL Final  . HCT 81/82/9937 48.1* 36 - 46 % Final  . MCV 05/19/2020 94.5  80.0 - 100.0 fL Final  . MCH 05/19/2020 29.9  26.0 - 34.0 pg Final  . MCHC 05/19/2020 31.6  30.0 - 36.0 g/dL Final  . RDW 16/96/7893 14.3  11.5 - 15.5 % Final    . Platelets 05/19/2020 205  150 - 400 K/uL Final  . nRBC 05/19/2020 0.0  0.0 - 0.2 % Final   Performed at [facility], [address] [place],  [place] [address]  . Prothrombin Time 05/19/2020 12.4  11.4 - 15.2 seconds Final  . INR 05/19/2020 1.0  0.8 - 1.2 Final   Comment: (NOTE) INR goal varies based on device and disease states. Performed at [facility], [address], [place], [place] [address]   . Color, Urine 05/19/2020 YELLOW  YELLOW Final  . APPearance 05/19/2020 CLEAR  CLEAR Final  . Specific Gravity, Urine 05/19/2020 1.006  1.005 - 1.030 Final  . pH 05/19/2020 6.0  5.0 - 8.0 Final  . Glucose, UA 05/19/2020 NEGATIVE  NEGATIVE mg/dL Final  . Hgb urine dipstick 05/19/2020 MODERATE* NEGATIVE Final  . Bilirubin Urine 05/19/2020 NEGATIVE  NEGATIVE Final  . Ketones, ur 05/19/2020 NEGATIVE  NEGATIVE mg/dL Final  . Protein, ur 84/66/5993 NEGATIVE  NEGATIVE mg/dL Final  . Nitrite 57/11/7791 NEGATIVE  NEGATIVE Final  . Glori Luis 05/19/2020 NEGATIVE  NEGATIVE Final  . RBC / HPF 05/19/2020 0-5  0 - 5 RBC/hpf Final  . WBC, UA 05/19/2020 0-5  0 - 5 WBC/hpf Final  . Bacteria, UA 05/19/2020 NONE SEEN  NONE SEEN Final  . Squamous Epithelial / LPF 05/19/2020 6-10  0 - 5 Final   Performed at [facility], [address], [place], [place] [address]  . MRSA, PCR 05/19/2020 NEGATIVE  NEGATIVE Final  . Staphylococcus aureus 05/19/2020 POSITIVE* NEGATIVE Final   Comment: (NOTE) The Xpert SA Assay (FDA approved for NASAL specimens in patients 41 years of age and older), is one component of a comprehensive surveillance program. It is not intended to diagnose infection nor to guide or monitor treatment. Performed at [facility], [address], [place], [place] [address]      X-Rays:DG Knee 1-2 Views Right  Result Date: 05/26/2020 CLINICAL DATA:  S/p RIGHT total knee replacement using cement. Imaging done in  recovery EXAM: RIGHT KNEE - 1-2 VIEW COMPARISON:  07/01/2009 FINDINGS: Interval RIGHT total knee arthroplasty. Hardware is intact and well-seated. No acute fracture. Postoperative soft tissue gas. Suprapatellar during. IMPRESSION: Status post RIGHT knee arthroplasty.  No adverse features. Electronically Signed   By: Norva Pavlov M.D.   On: 05/26/2020 12:31    EKG:No orders found for this or any previous visit.   Hospital Course: Ellen Bell is a 59 y.o. who was admitted to [facility]. They were brought to the operating room on 05/26/2020 and underwent Procedure(s): TOTAL KNEE ARTHROPLASTY.  Patient tolerated the procedure well and was later transferred to the recovery room and then to the orthopaedic floor for postoperative care.  They were given PO and IV analgesics for pain control following their surgery.  They were given 24 hours of postoperative antibiotics of  Anti-infectives (From admission, onward)   Start     Dose/Rate Route Frequency Ordered Stop   05/26/20 1430  ceFAZolin (ANCEF) IVPB 2g/100 mL premix        2 g 200 mL/hr over 30 Minutes Intravenous Every 6 hours 05/26/20 1408 05/27/20 1232   05/26/20 0911  polymyxin B 500,000 Units, bacitracin 50,000 Units in sodium chloride 0.9 % 500 mL irrigation  Status:  Discontinued          As needed 05/26/20 0911 05/26/20 1352   05/26/20 0845  clindamycin (CLEOCIN) IVPB 900 mg        900 mg 100 mL/hr over 30 Minutes Intravenous  Once 05/26/20 0844 05/26/20 0908   05/26/20 0845  clindamycin (CLEOCIN) 900 MG/50ML IVPB  Note to Pharmacy: Wylene SimmerShepherd, Karen   : cabinet override      05/26/20 0845 05/26/20 0848   05/26/20 0715  ceFAZolin (ANCEF) IVPB 2g/100 mL premix        2 g 200 mL/hr over 30 Minutes Intravenous On call to O.R. 05/26/20 0703 05/26/20 0845     and started on DVT prophylaxis in the form of Aspirin, TED hose and SCDs.   PT and OT were ordered for total joint protocol.  Discharge planning consulted to help with  postop disposition and equipment needs.  Patient had a good night on the evening of surgery.  They started to get up OOB with therapy on day one. Hemovac drain was pulled without difficulty.  Continued to work with therapy into day two.  Dressing was changed on day two and the incision was healing well.  By day three, the patient had progressed with therapy and meeting their goals.  Incision was healing well.  Patient was seen in rounds and was ready to go home.   Diet: Regular diet Activity:WBAT Follow-up:in 10-14 days Disposition - Home Discharged Condition: good    Allergies as of 05/27/2020      Reactions   Sulfa Antibiotics Anaphylaxis   Iodine Rash      Medication List    STOP taking these medications   HYDROcodone-acetaminophen 7.5-325 MG tablet Commonly known as: NORCO     TAKE these medications   aspirin 81 MG chewable tablet Chew 1 tablet (81 mg total) by mouth 2 (two) times daily.   Biotin 5000 MCG Tabs Take 5,000 mcg by mouth daily.   docusate sodium 100 MG capsule Commonly known as: COLACE Take 1 capsule (100 mg total) by mouth 2 (two) times daily as needed for mild constipation.   esomeprazole 20 MG capsule Commonly known as: NEXIUM Take 20 mg by mouth daily.   FeroSul 325 (65 FE) MG tablet Generic drug: ferrous sulfate Take 325 mg by mouth 2 (two) times daily with a meal.   methocarbamol 500 MG tablet Commonly known as: ROBAXIN Take 1 tablet (500 mg total) by mouth every 6 (six) hours as needed for muscle spasms.   oxyCODONE 5 MG immediate release tablet Commonly known as: Oxy IR/ROXICODONE Take 1-2 tablets (5-10 mg total) by mouth every 4 (four) hours as needed for moderate pain (pain score 4-6).   polyethylene glycol 17 g packet Commonly known as: MIRALAX / GLYCOLAX Take 17 g by mouth daily as needed for mild constipation.   simvastatin 40 MG tablet Commonly known as: ZOCOR Take 40 mg by mouth at bedtime.   Stiolto Respimat 2.5-2.5 MCG/ACT  Aers Generic drug: Tiotropium Bromide-Olodaterol Inhale 2 puffs into the lungs daily.   venlafaxine XR 150 MG 24 hr capsule Commonly known as: EFFEXOR-XR Take 300 mg by mouth daily.       Follow-up Information    Jene EveryBeane, Jeffrey, MD Follow up in 2 week(s).   Specialty: Orthopedic Surgery Contact information: [address] [place] [place] [id number] [phone]               Signed: Andrez GrimeJaclyn Ruchi Stoney, PA-C Orthopaedic Surgery 05/27/2020, 3:32 PM

## 2020-05-27 NOTE — Progress Notes (Signed)
Subjective: 1 Day Post-Op Procedure(s) (LRB): TOTAL KNEE ARTHROPLASTY (Right) Patient reports pain as 4 on 0-10 scale.   Denies CP or SOB.  Voiding without difficulty. Positive flatus. Objective: Vital signs in last 24 hours: Temp:  [97.5 F (36.4 C)-98.5 F (36.9 C)] 98.2 F (36.8 C) (07/22 0524) Pulse Rate:  [67-92] 69 (07/22 0524) Resp:  [8-20] 17 (07/22 0524) BP: (108-123)/(51-69) 119/63 (07/22 0524) SpO2:  [92 %-100 %] 98 % (07/22 0524)  Intake/Output from previous day: 07/21 0701 - 07/22 0700 In: 4424.2 [P.O.:1020; I.V.:3204.2; IV Piggyback:200] Out: 4930 [Urine:4325; Drains:55; Blood:550] Intake/Output this shift: No intake/output data recorded.  Recent Labs    05/26/20 1035 05/27/20 0322  HGB 12.2 12.0   Recent Labs    05/26/20 1035 05/27/20 0322  WBC  --  13.8*  RBC  --  4.08  HCT 36.0 38.5  PLT  --  173   Recent Labs    05/26/20 1035 05/27/20 0322  NA 143 142  K 4.0 3.9  CL 103 107  CO2  --  28  BUN 11 10  CREATININE 0.50 0.46  GLUCOSE 120* 159*  CALCIUM  --  8.7*   No results for input(s): LABPT, INR in the last 72 hours.  Neurologically intact Sensation intact distally Intact pulses distally Dorsiflexion/Plantar flexion intact Compartment soft Dressing dry clean.Hemovac d/c'd tip intact  Assessment/Plan: 1 Day Post-Op Procedure(s) (LRB): TOTAL KNEE ARTHROPLASTY (Right) Advance diet Up with therapy Plan for discharge tomorrow  Dressing change tomorrow  Javier Docker 05/27/2020, 7:22 AM

## 2020-05-27 NOTE — Progress Notes (Signed)
Physical Therapy Treatment Patient Details Name: Ellen Bell MRN: [id number] DOB: 28-Aug-1961 Today's Date: 05/27/2020    History of Present Illness Patient is 59 y.o. female s/p Rt TKA on 05/26/20 with PMH significant for OA, depression, anxiety.    PT Comments    Pt very cooperative but progressing slowly 2* poor pain control - RN aware.   Follow Up Recommendations  Follow surgeon's recommendation for DC plan and follow-up therapies     Equipment Recommendations  Rolling walker with 5" wheels    Recommendations for Other Services       Precautions / Restrictions Precautions Precautions: Fall Restrictions Weight Bearing Restrictions: No Other Position/Activity Restrictions: WBAT    Mobility  Bed Mobility Overal bed mobility: Needs Assistance Bed Mobility: Sit to Supine       Sit to supine: Min assist   General bed mobility comments: cues for sequence and use of L LE to self assist.  Physical assist to manage R LE  Transfers Overall transfer level: Needs assistance Equipment used: Rolling walker (2 wheeled) Transfers: Sit to/from Stand Sit to Stand: Min assist;Mod assist         General transfer comment: cues for LE management and use of UEs to self assist;  Physical assist to bring wt up and fwd and to balance in initial standing  Ambulation/Gait Ambulation/Gait assistance: Min assist Gait Distance (Feet): 24 Feet Assistive device: Rolling walker (2 wheeled) Gait Pattern/deviations: Step-to pattern;Decreased step length - left;Decreased stance time - right;Decreased stride length;Decreased weight shift to right Gait velocity: decr   General Gait Details: VC's for safe step pattern and proximity to RW, assist to manage walker position intermittently. pt limited by pain for distance.   Stairs             Wheelchair Mobility    Modified Rankin (Stroke Patients Only)       Balance Overall balance assessment: Needs assistance Sitting-balance  support: Feet supported;Bilateral upper extremity supported Sitting balance-Leahy Scale: Good     Standing balance support: During functional activity;Bilateral upper extremity supported Standing balance-Leahy Scale: Poor                              Cognition Arousal/Alertness: Awake/alert Behavior During Therapy: WFL for tasks assessed/performed Overall Cognitive Status: Within Functional Limits for tasks assessed                                        Exercises Total Joint Exercises Ankle Circles/Pumps: AROM;Both;20 reps;Seated Quad Sets: AROM;Both;5 reps;Supine Heel Slides: AAROM;Right;5 reps;Supine Straight Leg Raises: AAROM;Right;5 reps;Supine    General Comments        Pertinent Vitals/Pain Pain Assessment: 0-10 Pain Score: 9  Pain Location: Rt knee Pain Descriptors / Indicators: Aching;Discomfort Pain Intervention(s): Limited activity within patient's tolerance;Monitored during session;Premedicated before session;Ice applied    Home Living                      Prior Function            PT Goals (current goals can now be found in the care plan section) Acute Rehab PT Goals Patient Stated Goal: be able to get back to her garden PT Goal Formulation: With patient Time For Goal Achievement: 06/02/20 Potential to Achieve Goals: Good Progress towards PT goals: Progressing toward goals  Frequency    7X/week      PT Plan Current plan remains appropriate    Co-evaluation              AM-PAC PT "6 Clicks" Mobility   Outcome Measure  Help needed turning from your back to your side while in a flat bed without using bedrails?: A Little Help needed moving from lying on your back to sitting on the side of a flat bed without using bedrails?: A Little Help needed moving to and from a bed to a chair (including a wheelchair)?: A Little Help needed standing up from a chair using your arms (e.g., wheelchair or bedside  chair)?: A Lot Help needed to walk in hospital room?: A Little Help needed climbing 3-5 steps with a railing? : A Little 6 Click Score: 17    End of Session Equipment Utilized During Treatment: Gait belt Activity Tolerance: Patient limited by pain Patient left: in bed;with call bell/phone within reach;with bed alarm set Nurse Communication: Mobility status;Patient requests pain meds PT Visit Diagnosis: Muscle weakness (generalized) (M62.81);Difficulty in walking, not elsewhere classified (R26.2);Pain Pain - Right/Left: Right Pain - part of body: Knee     Time: 9371-6967 PT Time Calculation (min) (ACUTE ONLY): 27 min  Charges:  $Gait Training: 8-22 mins $Therapeutic Exercise: 8-22 mins                     Mauro Kaufmann PT Acute Rehabilitation Services Pager [phone] Office [phone]    Katrese Shell 05/27/2020, 12:42 PM

## 2020-05-27 NOTE — TOC Transition Note (Signed)
Transition of Care [facility]) - CM/SW Discharge Note   Patient Details  Name: Ellen Bell MRN: [id number] Date of Birth: May 30, 1961  [facility] of Care [facility]) CM/SW Contact:  Clearance Coots, LCSW Phone Number: 05/27/2020, 10:14 AM   Clinical Narrative:    Patient confirm therapy plan-OPPT starting Monday Patient reports she has raised toilet seats. The RW delivered to the patient bedside by [employer].    Final next level of care: OP Rehab Barriers to Discharge: No Barriers Identified   Patient Goals and CMS Choice     Choice offered to / list presented to : NA  Discharge Placement                       Discharge Plan and Services                DME Arranged: Walker rolling DME Agency: [employer] Date DME Agency Contacted: 05/27/20 Time DME Agency Contacted: 1014 Representative spoke with at DME Agency: Harrold Donath HH Arranged: NA HH Agency: NA        Social Determinants of Health (SDOH) Interventions     Readmission Risk Interventions No flowsheet data found.

## 2020-05-27 NOTE — Plan of Care (Signed)
Plan of care reviewed and discussed with the patient. 

## 2020-05-27 NOTE — Progress Notes (Signed)
Physical Therapy Treatment Patient Details Name: Ellen Bell MRN: [id number] DOB: [birth year] Today's Date: 05/27/2020    History of Present Illness Patient is 59 y.o. female s/p Rt TKA on 05/26/20 with PMH significant for OA, depression, anxiety.    PT Comments    Pt performed therex program with assist and multiple rest breaks required to complete task.  Pt requests rest break and IV pain meds before attempting ambulation but when checked on at 15:28 declined to attempt OOB 2* fatigue and pain.  Will follow.   Follow Up Recommendations  Follow surgeon's recommendation for DC plan and follow-up therapies     Equipment Recommendations  Rolling walker with 5" wheels    Recommendations for Other Services       Precautions / Restrictions Precautions Precautions: Fall Restrictions Weight Bearing Restrictions: No Other Position/Activity Restrictions: WBAT    Mobility  Bed Mobility               General bed mobility comments: OOB deferred at pt request 2* pain level  Transfers                    Ambulation/Gait                 Stairs             Wheelchair Mobility    Modified Rankin (Stroke Patients Only)       Balance                                            Cognition Arousal/Alertness: Awake/alert Behavior During Therapy: WFL for tasks assessed/performed Overall Cognitive Status: Within Functional Limits for tasks assessed                                        Exercises Total Joint Exercises Ankle Circles/Pumps: AROM;Both;20 reps;Seated Quad Sets: AROM;Both;Supine;10 reps Heel Slides: AAROM;Right;Supine;10 reps Straight Leg Raises: AAROM;Right;Supine;10 reps    General Comments        Pertinent Vitals/Pain Pain Assessment: 0-10 Pain Score: 9  Pain Location: Rt knee Pain Descriptors / Indicators: Aching;Discomfort Pain Intervention(s): Limited activity within patient's  tolerance;Monitored during session;Premedicated before session;Ice applied;Patient requesting pain meds-RN notified    Home Living                      Prior Function            PT Goals (current goals can now be found in the care plan section) Acute Rehab PT Goals Patient Stated Goal: be able to get back to her garden PT Goal Formulation: With patient Time For Goal Achievement: 06/02/20 Potential to Achieve Goals: Good Progress towards PT goals: Progressing toward goals    Frequency    7X/week      PT Plan Current plan remains appropriate    Co-evaluation              AM-PAC PT "6 Clicks" Mobility   Outcome Measure  Help needed turning from your back to your side while in a flat bed without using bedrails?: A Little Help needed moving from lying on your back to sitting on the side of a flat bed without using bedrails?: A Little Help needed moving to and from a bed  to a chair (including a wheelchair)?: A Little Help needed standing up from a chair using your arms (e.g., wheelchair or bedside chair)?: A Lot Help needed to walk in hospital room?: A Little Help needed climbing 3-5 steps with a railing? : A Lot 6 Click Score: 16    End of Session Equipment Utilized During Treatment: Gait belt Activity Tolerance: Patient limited by pain Patient left: in bed;with call bell/phone within reach;with bed alarm set Nurse Communication: Mobility status;Patient requests pain meds PT Visit Diagnosis: Muscle weakness (generalized) (M62.81);Difficulty in walking, not elsewhere classified (R26.2);Pain Pain - Right/Left: Right Pain - part of body: Knee     Time: 9758-8325 PT Time Calculation (min) (ACUTE ONLY): 19 min  Charges:  $Therapeutic Exercise: 8-22 mins                     Ellen Bell PT Acute Rehabilitation Services Pager [phone] Office [phone]    Ellen Bell 05/27/2020, 3:50 PM

## 2020-05-28 ENCOUNTER — Encounter (HOSPITAL_COMMUNITY): Payer: Self-pay | Admitting: Specialist

## 2020-05-28 LAB — CBC
HCT: 36.8 % (ref 36.0–46.0)
Hemoglobin: 11.8 g/dL — ABNORMAL LOW (ref 12.0–15.0)
MCH: 29.5 pg (ref 26.0–34.0)
MCHC: 32.1 g/dL (ref 30.0–36.0)
MCV: 92 fL (ref 80.0–100.0)
Platelets: 161 10*3/uL (ref 150–400)
RBC: 4 MIL/uL (ref 3.87–5.11)
RDW: 14.4 % (ref 11.5–15.5)
WBC: 10.7 10*3/uL — ABNORMAL HIGH (ref 4.0–10.5)
nRBC: 0 % (ref 0.0–0.2)

## 2020-05-28 NOTE — Progress Notes (Signed)
Physical Therapy Treatment Patient Details Name: Ellen Bell MRN: [id number] DOB: May 19, 1961 Today's Date: 05/28/2020    History of Present Illness Patient is 59 y.o. female s/p Rt TKA on 05/26/20 with PMH significant for OA, depression, anxiety.    PT Comments    Pt progressing slowly but steadily with mobility - limited by pain and fatigue.  Pt hopeful for dc home tomorrow.   Follow Up Recommendations  Follow surgeon's recommendation for DC plan and follow-up therapies     Equipment Recommendations  Rolling walker with 5" wheels    Recommendations for Other Services       Precautions / Restrictions Precautions Precautions: Fall Restrictions Weight Bearing Restrictions: No Other Position/Activity Restrictions: WBAT    Mobility  Bed Mobility               General bed mobility comments: Pt up in chair and requests back to same  Transfers Overall transfer level: Needs assistance Equipment used: Rolling walker (2 wheeled) Transfers: Sit to/from Stand Sit to Stand: Min assist         General transfer comment: cues for LE management and use of UEs to self assist;  Physical assist to bring wt up and fwd and to balance in initial standing.  Pt to/from recliner and BSC  Ambulation/Gait Ambulation/Gait assistance: Min assist Gait Distance (Feet): 24 Feet (and 10') Assistive device: Rolling walker (2 wheeled) Gait Pattern/deviations: Step-to pattern;Decreased step length - left;Decreased stance time - right;Decreased stride length;Decreased weight shift to right Gait velocity: decr   General Gait Details: Increased time with VC's for safe step pattern and proximity to RW, assist to manage walker position intermittently. pt limited by fatigue for distance.   Stairs             Wheelchair Mobility    Modified Rankin (Stroke Patients Only)       Balance Overall balance assessment: Needs assistance Sitting-balance support: Feet supported;Bilateral  upper extremity supported Sitting balance-Leahy Scale: Good     Standing balance support: During functional activity;Bilateral upper extremity supported Standing balance-Leahy Scale: Poor                              Cognition Arousal/Alertness: Awake/alert Behavior During Therapy: WFL for tasks assessed/performed Overall Cognitive Status: Within Functional Limits for tasks assessed                                        Exercises      General Comments        Pertinent Vitals/Pain Pain Assessment: 0-10 Pain Score: 7  Pain Location: Rt knee Pain Descriptors / Indicators: Aching;Discomfort Pain Intervention(s): Limited activity within patient's tolerance;Monitored during session;Premedicated before session;Ice applied    Home Living                      Prior Function            PT Goals (current goals can now be found in the care plan section) Acute Rehab PT Goals Patient Stated Goal: be able to get back to her garden PT Goal Formulation: With patient Time For Goal Achievement: 06/02/20 Potential to Achieve Goals: Good Progress towards PT goals: Progressing toward goals    Frequency    7X/week      PT Plan Current plan remains appropriate  Co-evaluation              AM-PAC PT "6 Clicks" Mobility   Outcome Measure  Help needed turning from your back to your side while in a flat bed without using bedrails?: A Little Help needed moving from lying on your back to sitting on the side of a flat bed without using bedrails?: A Little Help needed moving to and from a bed to a chair (including a wheelchair)?: A Little Help needed standing up from a chair using your arms (e.g., wheelchair or bedside chair)?: A Little Help needed to walk in hospital room?: A Little Help needed climbing 3-5 steps with a railing? : A Lot 6 Click Score: 17    End of Session Equipment Utilized During Treatment: Gait belt;Right knee  immobilizer Activity Tolerance: Patient tolerated treatment well;Patient limited by fatigue;Patient limited by pain Patient left: in chair;with call bell/phone within reach;with chair alarm set Nurse Communication: Mobility status PT Visit Diagnosis: Muscle weakness (generalized) (M62.81);Difficulty in walking, not elsewhere classified (R26.2);Pain Pain - Right/Left: Right Pain - part of body: Knee     Time: 3500-9381 PT Time Calculation (min) (ACUTE ONLY): 28 min  Charges:  $Gait Training: 8-22 mins $Therapeutic Activity: 8-22 mins                     Mauro Kaufmann PT Acute Rehabilitation Services Pager [phone] Office [phone]    Ellen Bell 05/28/2020, 4:24 PM

## 2020-05-28 NOTE — Progress Notes (Signed)
Subjective: 2 Days Post-Op Procedure(s) (LRB): TOTAL KNEE ARTHROPLASTY (Right) Patient reports pain as moderate and severe.    Objective: Vital signs in last 24 hours: Temp:  [98 F (36.7 C)-98.8 F (37.1 C)] 98 F (36.7 C) (07/23 0507) Pulse Rate:  [76-92] 92 (07/23 0507) Resp:  [16-18] 16 (07/23 0507) BP: (126-134)/(64-70) 126/64 (07/23 0507) SpO2:  [89 %-94 %] 93 % (07/23 0736)  Intake/Output from previous day: 07/22 0701 - 07/23 0700 In: 936.4 [P.O.:680; I.V.:156.4; IV Piggyback:100] Out: 1400 [Urine:1400] Intake/Output this shift: No intake/output data recorded.  Recent Labs    05/26/20 1035 05/27/20 0322 05/28/20 0304  HGB 12.2 12.0 11.8*   Recent Labs    05/27/20 0322 05/28/20 0304  WBC 13.8* 10.7*  RBC 4.08 4.00  HCT 38.5 36.8  PLT 173 161   Recent Labs    05/26/20 1035 05/27/20 0322  NA 143 142  K 4.0 3.9  CL 103 107  CO2  --  28  BUN 11 10  CREATININE 0.50 0.46  GLUCOSE 120* 159*  CALCIUM  --  8.7*   No results for input(s): LABPT, INR in the last 72 hours.  Neurologically intact ABD soft Neurovascular intact Sensation intact distally Intact pulses distally Dorsiflexion/Plantar flexion intact Incision: dressing C/D/I and scant drainage No cellulitis present Compartment soft no calf pain or sign of DVT Dried bloody drainage minimal amount distal dressing  Assessment/Plan: 2 Days Post-Op Procedure(s) (LRB): TOTAL KNEE ARTHROPLASTY (Right) Advance diet Up with therapy D/C IV fluids D/C later today vs. Tomorrow depending on pain and progression with PT Scheduled for outpatient PT Tuesday Change to aquacel dressing prior to D/C Discussed with Dr. Tenna Delaine Doralee Albino 05/28/2020, 8:55 AM

## 2020-05-28 NOTE — Progress Notes (Signed)
Physical Therapy Treatment Patient Details Name: Ellen Bell MRN: [id number] DOB: September 08, 1961 Today's Date: 05/28/2020    History of Present Illness Patient is 59 y.o. female s/p Rt TKA on 05/26/20 with PMH significant for OA, depression, anxiety.    PT Comments    Pt with improved pain control and noted improvement in activity tolerance but continues to fatigue easily and progressing slowly with mobility.   Follow Up Recommendations  Follow surgeon's recommendation for DC plan and follow-up therapies     Equipment Recommendations  Rolling walker with 5" wheels    Recommendations for Other Services       Precautions / Restrictions Precautions Precautions: Fall Restrictions Weight Bearing Restrictions: No Other Position/Activity Restrictions: WBAT    Mobility  Bed Mobility Overal bed mobility: Needs Assistance Bed Mobility: Supine to Sit     Supine to sit: Min assist;HOB elevated     General bed mobility comments: cues for sequence, pt self assisting R LE with UEs  Transfers Overall transfer level: Needs assistance Equipment used: Rolling walker (2 wheeled) Transfers: Sit to/from Stand Sit to Stand: Min assist;From elevated surface         General transfer comment: cues for LE management and use of UEs to self assist;  Physical assist to bring wt up and fwd and to balance in initial standing  Ambulation/Gait Ambulation/Gait assistance: Min assist Gait Distance (Feet): 27 Feet Assistive device: Rolling walker (2 wheeled) Gait Pattern/deviations: Step-to pattern;Decreased step length - left;Decreased stance time - right;Decreased stride length;Decreased weight shift to right Gait velocity: decr   General Gait Details: VC's for safe step pattern and proximity to RW, assist to manage walker position intermittently. pt limited by fatigue for distance.   Stairs             Wheelchair Mobility    Modified Rankin (Stroke Patients Only)        Balance Overall balance assessment: Needs assistance Sitting-balance support: Feet supported;Bilateral upper extremity supported Sitting balance-Leahy Scale: Good     Standing balance support: During functional activity;Bilateral upper extremity supported Standing balance-Leahy Scale: Poor                              Cognition Arousal/Alertness: Awake/alert Behavior During Therapy: WFL for tasks assessed/performed Overall Cognitive Status: Within Functional Limits for tasks assessed                                        Exercises Total Joint Exercises Ankle Circles/Pumps: AROM;Both;20 reps;Seated Quad Sets: AROM;Both;Supine;10 reps Heel Slides: AAROM;Right;Supine;15 reps Straight Leg Raises: AAROM;Right;Supine;10 reps Goniometric ROM: AAROM R knee -5 - 45    General Comments        Pertinent Vitals/Pain Pain Assessment: 0-10 Pain Score: 6  Pain Location: Rt knee Pain Descriptors / Indicators: Aching;Discomfort Pain Intervention(s): Limited activity within patient's tolerance;Monitored during session;Premedicated before session;Ice applied    Home Living                      Prior Function            PT Goals (current goals can now be found in the care plan section) Acute Rehab PT Goals Patient Stated Goal: be able to get back to her garden PT Goal Formulation: With patient Time For Goal Achievement: 06/02/20 Potential to Achieve Goals: Good  Progress towards PT goals: Progressing toward goals    Frequency    7X/week      PT Plan Current plan remains appropriate    Co-evaluation              AM-PAC PT "6 Clicks" Mobility   Outcome Measure  Help needed turning from your back to your side while in a flat bed without using bedrails?: A Little Help needed moving from lying on your back to sitting on the side of a flat bed without using bedrails?: A Little Help needed moving to and from a bed to a chair  (including a wheelchair)?: A Little Help needed standing up from a chair using your arms (e.g., wheelchair or bedside chair)?: A Lot Help needed to walk in hospital room?: A Little Help needed climbing 3-5 steps with a railing? : A Lot 6 Click Score: 16    End of Session Equipment Utilized During Treatment: Gait belt;Right knee immobilizer Activity Tolerance: Patient limited by fatigue;Patient limited by pain Patient left: in chair;with call bell/phone within reach;with chair alarm set Nurse Communication: Mobility status PT Visit Diagnosis: Muscle weakness (generalized) (M62.81);Difficulty in walking, not elsewhere classified (R26.2);Pain Pain - Right/Left: Right Pain - part of body: Knee     Time: 1021-1057 PT Time Calculation (min) (ACUTE ONLY): 36 min  Charges:  $Gait Training: 8-22 mins $Therapeutic Exercise: 8-22 mins                     Mauro Kaufmann PT Acute Rehabilitation Services Pager [phone] Office [phone]    Ellen Bell 05/28/2020, 12:27 PM

## 2020-05-29 LAB — CBC
HCT: 37.4 % (ref 36.0–46.0)
Hemoglobin: 11.8 g/dL — ABNORMAL LOW (ref 12.0–15.0)
MCH: 29.6 pg (ref 26.0–34.0)
MCHC: 31.6 g/dL (ref 30.0–36.0)
MCV: 93.7 fL (ref 80.0–100.0)
Platelets: 160 10*3/uL (ref 150–400)
RBC: 3.99 MIL/uL (ref 3.87–5.11)
RDW: 14.3 % (ref 11.5–15.5)
WBC: 9.2 10*3/uL (ref 4.0–10.5)
nRBC: 0 % (ref 0.0–0.2)

## 2020-05-29 NOTE — Progress Notes (Signed)
DONNESHA KARG  MRN: [id number] DOB/Age: 03-26-1961 59 y.o. [facility] Procedure: Procedure(s) (LRB): TOTAL KNEE ARTHROPLASTY (Right)     Subjective: Feeling better today, pain better controlled. Ready to go home. Voiding and passing flatus  Vital Signs Temp:  [98 F (36.7 C)-98.5 F (36.9 C)] 98.5 F (36.9 C) (07/24 0503) Pulse Rate:  [91-96] 92 (07/24 0503) Resp:  [17-20] 20 (07/24 0503) BP: (122-127)/(64-76) 122/64 (07/24 0503) SpO2:  [90 %-95 %] 90 % (07/24 0836)  Lab Results Recent Labs    05/28/20 0304 05/29/20 0324  WBC 10.7* 9.2  HGB 11.8* 11.8*  HCT 36.8 37.4  PLT 161 160   BMET Recent Labs    05/26/20 1035 05/27/20 0322  NA 143 142  K 4.0 3.9  CL 103 107  CO2  --  28  GLUCOSE 120* 159*  BUN 11 10  CREATININE 0.50 0.46  CALCIUM  --  8.7*   INR  Date Value Ref Range Status  05/19/2020 1.0 0.8 - 1.2 Final    Comment:    (NOTE) INR goal varies based on device and disease states. Performed at [facility], [address] [place], [place] [address]      Exam Right knee dressing removed Staples intact aquacel replaced NVI        Plan DC home Outpatient PT on Tuesday  [facility] PA-C  05/29/2020, 9:16 AM Contact # [phone]

## 2020-05-29 NOTE — Progress Notes (Signed)
Physical Therapy Treatment Patient Details Name: Ellen Bell MRN: [id number] DOB: May 11, 1961 Today's Date: 05/29/2020    History of Present Illness Patient is 59 y.o. female s/p Rt TKA on 05/26/20 with PMH significant for OA, depression, anxiety.    PT Comments    POD # 3 Pt eager to "go home today".  Assisted OOB.  General bed mobility comments: demonstarted and instructed how to use a belt to self assist LE.  Amb in hallway.  General Gait Details: increased time and 25% VC's on safety with turns.  Practiced 2 step stairs.  General stair comments: 50% VC's on proper sequencing and increased time.  Reviewed TKR TE's instructed on freq, reps and use of ICE.  Addressed all mobility questions, discussed appropriate activity, educated on use of ICE.  Pt ready for D/C to home.   Follow Up Recommendations  Follow surgeons recommendation for DC plan and follow-up therapies     Equipment Recommendations  Rolling walker with 5" wheels    Recommendations for Other Services       Precautions / Restrictions Precautions Precautions: Fall Precaution Comments: reviewed no pillow under knee Restrictions Weight Bearing Restrictions: No Other Position/Activity Restrictions: WBAT    Mobility  Bed Mobility Overal bed mobility: Needs Assistance Bed Mobility: Supine to Sit     Supine to sit: Supervision     General bed mobility comments: demonstarted and instructed how to use a belt to self assist LE  Transfers Overall transfer level: Needs assistance Equipment used: Rolling walker (2 wheeled) Transfers: Sit to/from [employer] Sit to Stand: Supervision Stand pivot transfers: Supervision       General transfer comment: 25% VC's on proper hand placement and safety with turns.  Also assisted with a BSC transfer.  Ambulation/Gait Ambulation/Gait assistance: Supervision Gait Distance (Feet): 34 Feet Assistive device: Rolling walker (2 wheeled) Gait  Pattern/deviations: Step-to pattern;Decreased step length - left;Decreased stance time - right;Decreased stride length;Decreased weight shift to right Gait velocity: decreased   General Gait Details: increased time and 25% VC's on safety with turns   Stairs Stairs: Yes Stairs assistance: Supervision;Min guard Stair Management: Two rails;Forwards Number of Stairs: 2 General stair comments: 50% VC's on proper sequencing and increased time   Wheelchair Mobility    Modified Rankin (Stroke Patients Only)       Balance                                            Cognition Arousal/Alertness: Awake/alert Behavior During Therapy: WFL for tasks assessed/performed Overall Cognitive Status: Within Functional Limits for tasks assessed                                 General Comments: AxO XS 3 eager to go home today "I'm ready"      Exercises      General Comments        Pertinent Vitals/Pain Pain Assessment: No/denies pain Pain Score: 7  Pain Location: Rt knee Pain Descriptors / Indicators: Aching;Discomfort;Operative site guarding Pain Intervention(s): Monitored during session;Premedicated before session;Repositioned;Ice applied    Home Living                      Prior Function            PT Goals (current goals  can now be found in the care plan section) Progress towards PT goals: Progressing toward goals    Frequency    7X/week      PT Plan Current plan remains appropriate    Co-evaluation              AM-PAC PT "6 Clicks" Mobility   Outcome Measure  Help needed turning from your back to your side while in a flat bed without using bedrails?: None Help needed moving from lying on your back to sitting on the side of a flat bed without using bedrails?: None Help needed moving to and from a bed to a chair (including a wheelchair)?: None Help needed standing up from a chair using your arms (e.g., wheelchair or  bedside chair)?: None Help needed to walk in hospital room?: None Help needed climbing 3-5 steps with a railing? : A Little 6 Click Score: 23    End of Session Equipment Utilized During Treatment: Gait belt Activity Tolerance: Patient tolerated treatment well Patient left: in chair;with call bell/phone within reach Nurse Communication: Mobility status (pt eager to go home, ride coming about 11:30) PT Visit Diagnosis: Muscle weakness (generalized) (M62.81);Difficulty in walking, not elsewhere classified (R26.2);Pain Pain - Right/Left: Right Pain - part of body: Knee     Time: 0945-1010 PT Time Calculation (min) (ACUTE ONLY): 25 min  Charges:  $Gait Training: 8-22 mins $Therapeutic Activity: 8-22 mins                     Felecia Shelling  PTA Acute  Rehabilitation Services Pager      [phone] Office      [phone]

## 2020-06-07 DIAGNOSIS — Z96651 Presence of right artificial knee joint: Secondary | ICD-10-CM | POA: Diagnosis not present

## 2020-06-07 DIAGNOSIS — R2689 Other abnormalities of gait and mobility: Secondary | ICD-10-CM | POA: Diagnosis not present

## 2020-06-07 DIAGNOSIS — M62551 Muscle wasting and atrophy, not elsewhere classified, right thigh: Secondary | ICD-10-CM | POA: Diagnosis not present

## 2020-06-07 DIAGNOSIS — M25561 Pain in right knee: Secondary | ICD-10-CM | POA: Diagnosis not present

## 2020-06-07 DIAGNOSIS — M25461 Effusion, right knee: Secondary | ICD-10-CM | POA: Diagnosis not present

## 2020-06-16 DIAGNOSIS — Z96651 Presence of right artificial knee joint: Secondary | ICD-10-CM | POA: Diagnosis not present

## 2020-06-16 DIAGNOSIS — M25561 Pain in right knee: Secondary | ICD-10-CM | POA: Diagnosis not present

## 2020-06-16 DIAGNOSIS — R2689 Other abnormalities of gait and mobility: Secondary | ICD-10-CM | POA: Diagnosis not present

## 2020-06-16 DIAGNOSIS — M25461 Effusion, right knee: Secondary | ICD-10-CM | POA: Diagnosis not present

## 2020-06-16 DIAGNOSIS — M62551 Muscle wasting and atrophy, not elsewhere classified, right thigh: Secondary | ICD-10-CM | POA: Diagnosis not present

## 2020-06-23 DIAGNOSIS — M25562 Pain in left knee: Secondary | ICD-10-CM | POA: Diagnosis not present

## 2020-06-23 DIAGNOSIS — M25561 Pain in right knee: Secondary | ICD-10-CM | POA: Diagnosis not present

## 2020-06-23 DIAGNOSIS — G8929 Other chronic pain: Secondary | ICD-10-CM | POA: Diagnosis not present

## 2020-06-23 DIAGNOSIS — Z79899 Other long term (current) drug therapy: Secondary | ICD-10-CM | POA: Diagnosis not present

## 2020-06-23 DIAGNOSIS — E1165 Type 2 diabetes mellitus with hyperglycemia: Secondary | ICD-10-CM | POA: Diagnosis not present

## 2020-06-24 DIAGNOSIS — Z96651 Presence of right artificial knee joint: Secondary | ICD-10-CM | POA: Diagnosis not present

## 2020-06-24 DIAGNOSIS — M25561 Pain in right knee: Secondary | ICD-10-CM | POA: Diagnosis not present

## 2020-06-24 DIAGNOSIS — M25461 Effusion, right knee: Secondary | ICD-10-CM | POA: Diagnosis not present

## 2020-06-24 DIAGNOSIS — M62551 Muscle wasting and atrophy, not elsewhere classified, right thigh: Secondary | ICD-10-CM | POA: Diagnosis not present

## 2020-06-24 DIAGNOSIS — R2689 Other abnormalities of gait and mobility: Secondary | ICD-10-CM | POA: Diagnosis not present

## 2020-06-28 DIAGNOSIS — M25561 Pain in right knee: Secondary | ICD-10-CM | POA: Diagnosis not present

## 2020-06-28 DIAGNOSIS — M25461 Effusion, right knee: Secondary | ICD-10-CM | POA: Diagnosis not present

## 2020-06-28 DIAGNOSIS — R2689 Other abnormalities of gait and mobility: Secondary | ICD-10-CM | POA: Diagnosis not present

## 2020-06-28 DIAGNOSIS — Z96651 Presence of right artificial knee joint: Secondary | ICD-10-CM | POA: Diagnosis not present

## 2020-06-28 DIAGNOSIS — M62551 Muscle wasting and atrophy, not elsewhere classified, right thigh: Secondary | ICD-10-CM | POA: Diagnosis not present

## 2020-07-27 DIAGNOSIS — E1165 Type 2 diabetes mellitus with hyperglycemia: Secondary | ICD-10-CM | POA: Diagnosis not present

## 2020-07-27 DIAGNOSIS — M25561 Pain in right knee: Secondary | ICD-10-CM | POA: Diagnosis not present

## 2020-07-27 DIAGNOSIS — M25562 Pain in left knee: Secondary | ICD-10-CM | POA: Diagnosis not present

## 2020-07-27 DIAGNOSIS — G8929 Other chronic pain: Secondary | ICD-10-CM | POA: Diagnosis not present

## 2020-07-27 DIAGNOSIS — Z79899 Other long term (current) drug therapy: Secondary | ICD-10-CM | POA: Diagnosis not present

## 2020-08-30 DIAGNOSIS — E559 Vitamin D deficiency, unspecified: Secondary | ICD-10-CM | POA: Diagnosis not present

## 2020-08-30 DIAGNOSIS — M25562 Pain in left knee: Secondary | ICD-10-CM | POA: Diagnosis not present

## 2020-08-30 DIAGNOSIS — E1165 Type 2 diabetes mellitus with hyperglycemia: Secondary | ICD-10-CM | POA: Diagnosis not present

## 2020-08-30 DIAGNOSIS — G8929 Other chronic pain: Secondary | ICD-10-CM | POA: Diagnosis not present

## 2020-08-30 DIAGNOSIS — M25561 Pain in right knee: Secondary | ICD-10-CM | POA: Diagnosis not present

## 2020-08-30 DIAGNOSIS — M25551 Pain in right hip: Secondary | ICD-10-CM | POA: Diagnosis not present

## 2020-10-01 DIAGNOSIS — M25551 Pain in right hip: Secondary | ICD-10-CM | POA: Diagnosis not present

## 2020-10-01 DIAGNOSIS — M25561 Pain in right knee: Secondary | ICD-10-CM | POA: Diagnosis not present

## 2020-10-01 DIAGNOSIS — M170 Bilateral primary osteoarthritis of knee: Secondary | ICD-10-CM | POA: Diagnosis not present

## 2020-10-01 DIAGNOSIS — G8929 Other chronic pain: Secondary | ICD-10-CM | POA: Diagnosis not present

## 2020-11-11 DIAGNOSIS — M25561 Pain in right knee: Secondary | ICD-10-CM | POA: Diagnosis not present

## 2020-11-11 DIAGNOSIS — M25551 Pain in right hip: Secondary | ICD-10-CM | POA: Diagnosis not present

## 2020-11-11 DIAGNOSIS — G8929 Other chronic pain: Secondary | ICD-10-CM | POA: Diagnosis not present

## 2020-11-11 DIAGNOSIS — Z96651 Presence of right artificial knee joint: Secondary | ICD-10-CM | POA: Diagnosis not present

## 2020-11-12 DIAGNOSIS — Z20822 Contact with and (suspected) exposure to covid-19: Secondary | ICD-10-CM | POA: Diagnosis not present

## 2020-11-12 DIAGNOSIS — U071 COVID-19: Secondary | ICD-10-CM | POA: Diagnosis not present

## 2020-11-15 DIAGNOSIS — U071 COVID-19: Secondary | ICD-10-CM | POA: Diagnosis not present

## 2020-11-15 DIAGNOSIS — B370 Candidal stomatitis: Secondary | ICD-10-CM | POA: Diagnosis not present

## 2020-11-15 DIAGNOSIS — Z9189 Other specified personal risk factors, not elsewhere classified: Secondary | ICD-10-CM | POA: Diagnosis not present

## 2020-12-10 DIAGNOSIS — M25561 Pain in right knee: Secondary | ICD-10-CM | POA: Diagnosis not present

## 2020-12-10 DIAGNOSIS — G8929 Other chronic pain: Secondary | ICD-10-CM | POA: Diagnosis not present

## 2020-12-10 DIAGNOSIS — M25562 Pain in left knee: Secondary | ICD-10-CM | POA: Diagnosis not present

## 2020-12-10 DIAGNOSIS — Z79899 Other long term (current) drug therapy: Secondary | ICD-10-CM | POA: Diagnosis not present

## 2020-12-10 DIAGNOSIS — F17210 Nicotine dependence, cigarettes, uncomplicated: Secondary | ICD-10-CM | POA: Diagnosis not present

## 2020-12-10 DIAGNOSIS — Z96651 Presence of right artificial knee joint: Secondary | ICD-10-CM | POA: Diagnosis not present

## 2021-01-10 DIAGNOSIS — G8929 Other chronic pain: Secondary | ICD-10-CM | POA: Diagnosis not present

## 2021-01-10 DIAGNOSIS — M25561 Pain in right knee: Secondary | ICD-10-CM | POA: Diagnosis not present

## 2021-01-10 DIAGNOSIS — M25551 Pain in right hip: Secondary | ICD-10-CM | POA: Diagnosis not present

## 2021-01-10 DIAGNOSIS — Z96651 Presence of right artificial knee joint: Secondary | ICD-10-CM | POA: Diagnosis not present

## 2021-01-10 DIAGNOSIS — Z79899 Other long term (current) drug therapy: Secondary | ICD-10-CM | POA: Diagnosis not present

## 2021-01-10 DIAGNOSIS — Z0000 Encounter for general adult medical examination without abnormal findings: Secondary | ICD-10-CM | POA: Diagnosis not present

## 2021-01-10 DIAGNOSIS — F17210 Nicotine dependence, cigarettes, uncomplicated: Secondary | ICD-10-CM | POA: Diagnosis not present

## 2021-02-08 DIAGNOSIS — Z79899 Other long term (current) drug therapy: Secondary | ICD-10-CM | POA: Diagnosis not present

## 2021-02-17 IMAGING — DX DG KNEE 1-2V*R*
2 series · 2 of 2 positions shown · non-contrast
Comparison: 07/01/2009

CLINICAL DATA: S/p RIGHT total knee replacement using cement.
Imaging done in recovery

EXAM:
RIGHT KNEE - 1-2 VIEW

[knee ap]
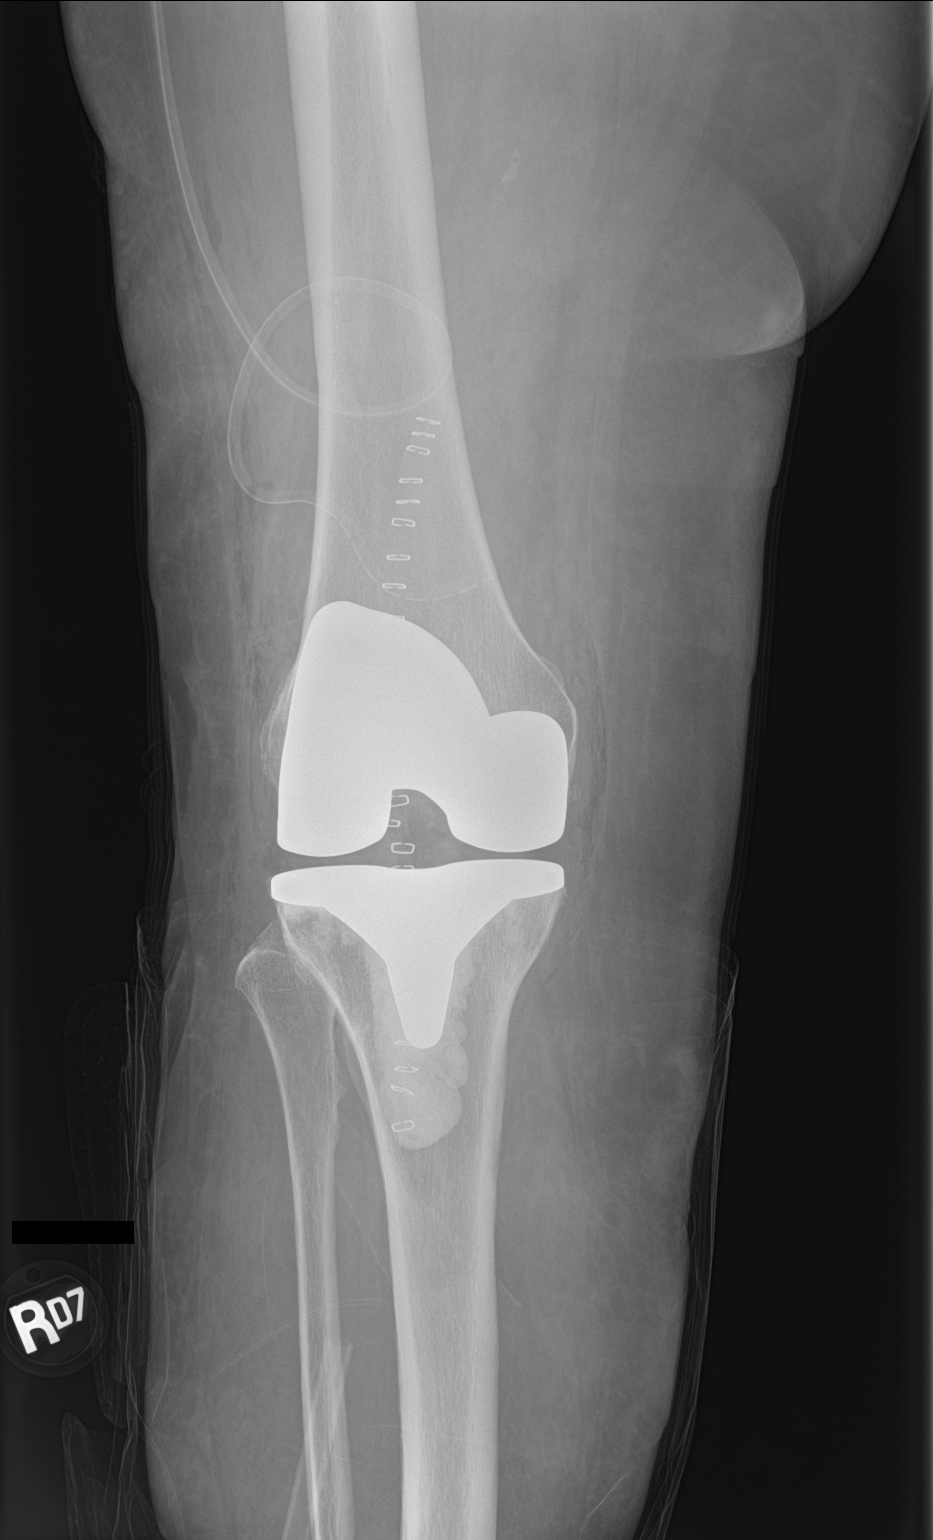

[knee lat]
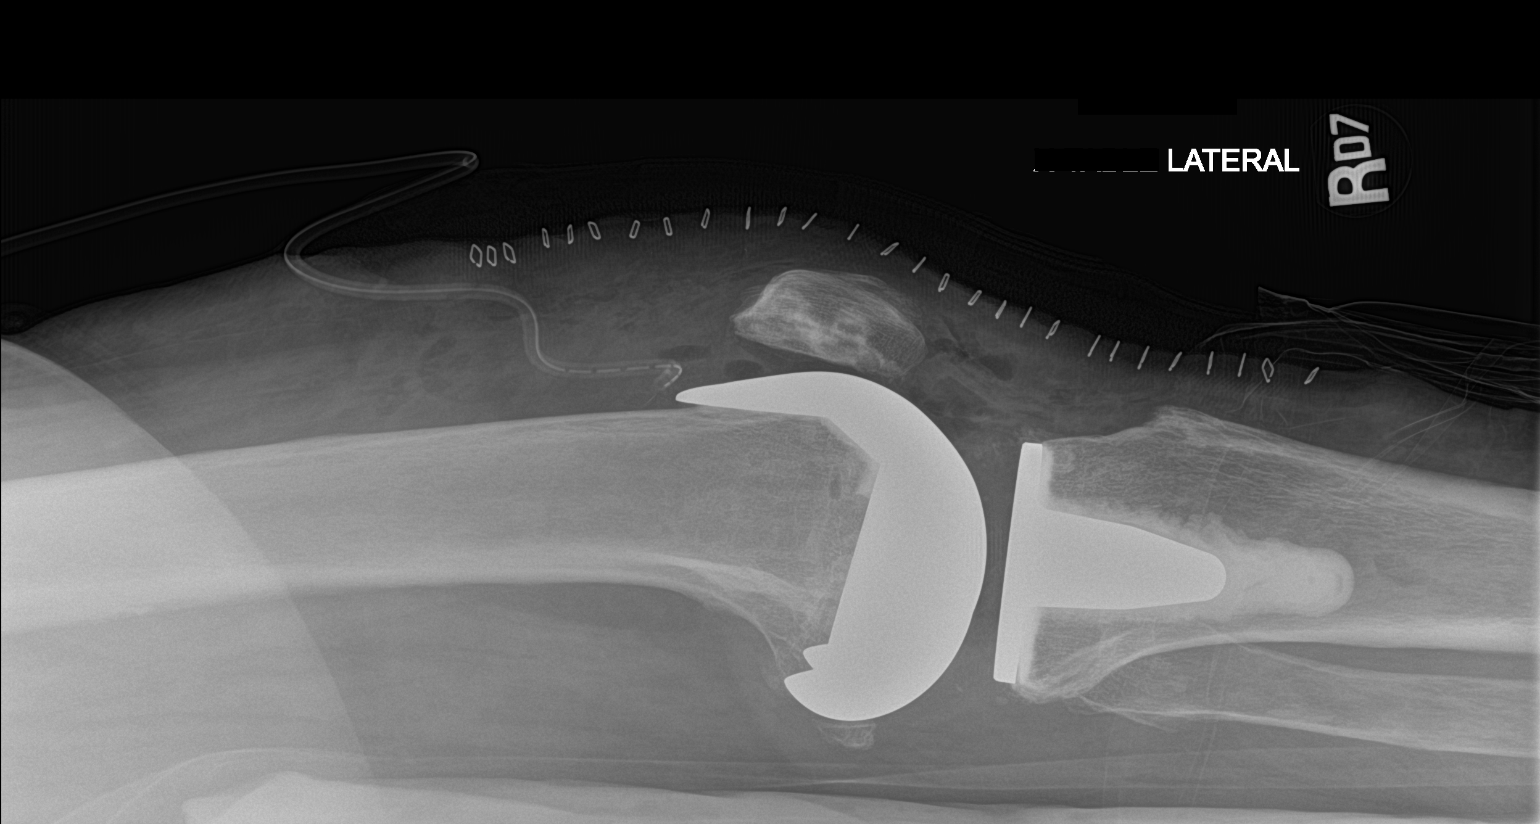

[2 of 2 positions shown; findings below may reference images not displayed]

FINDINGS: Interval RIGHT total knee arthroplasty. Hardware is intact and
well-seated. No acute fracture. Postoperative soft tissue gas.
Suprapatellar during.
IMPRESSION: Status post RIGHT knee arthroplasty.  No adverse features.

## 2021-03-08 DIAGNOSIS — M25562 Pain in left knee: Secondary | ICD-10-CM | POA: Diagnosis not present

## 2021-03-08 DIAGNOSIS — G8929 Other chronic pain: Secondary | ICD-10-CM | POA: Diagnosis not present

## 2021-03-08 DIAGNOSIS — M25561 Pain in right knee: Secondary | ICD-10-CM | POA: Diagnosis not present

## 2021-03-08 DIAGNOSIS — Z96651 Presence of right artificial knee joint: Secondary | ICD-10-CM | POA: Diagnosis not present

## 2021-03-08 DIAGNOSIS — Z79899 Other long term (current) drug therapy: Secondary | ICD-10-CM | POA: Diagnosis not present

## 2021-04-07 DIAGNOSIS — E7800 Pure hypercholesterolemia, unspecified: Secondary | ICD-10-CM | POA: Diagnosis not present

## 2021-04-07 DIAGNOSIS — D539 Nutritional anemia, unspecified: Secondary | ICD-10-CM | POA: Diagnosis not present

## 2021-04-07 DIAGNOSIS — R7302 Impaired glucose tolerance (oral): Secondary | ICD-10-CM | POA: Diagnosis not present

## 2021-04-07 DIAGNOSIS — E559 Vitamin D deficiency, unspecified: Secondary | ICD-10-CM | POA: Diagnosis not present

## 2021-04-07 DIAGNOSIS — Z79899 Other long term (current) drug therapy: Secondary | ICD-10-CM | POA: Diagnosis not present

## 2021-05-03 DIAGNOSIS — Z79899 Other long term (current) drug therapy: Secondary | ICD-10-CM | POA: Diagnosis not present

## 2021-05-30 DIAGNOSIS — M25551 Pain in right hip: Secondary | ICD-10-CM | POA: Diagnosis not present

## 2021-05-30 DIAGNOSIS — Z79899 Other long term (current) drug therapy: Secondary | ICD-10-CM | POA: Diagnosis not present

## 2021-05-30 DIAGNOSIS — M25562 Pain in left knee: Secondary | ICD-10-CM | POA: Diagnosis not present

## 2021-05-30 DIAGNOSIS — M25561 Pain in right knee: Secondary | ICD-10-CM | POA: Diagnosis not present

## 2021-05-30 DIAGNOSIS — G8929 Other chronic pain: Secondary | ICD-10-CM | POA: Diagnosis not present
